# Patient Record
Sex: Male | Born: 1970 | Race: White | Hispanic: No | Marital: Married | Smoking: Never smoker
Health system: Southern US, Community
[De-identification: ages and names within clinical notes are randomized; demographics above are authoritative.]

## PROBLEM LIST (undated history)

## (undated) DIAGNOSIS — K219 Gastro-esophageal reflux disease without esophagitis: Secondary | ICD-10-CM

## (undated) DIAGNOSIS — L309 Dermatitis, unspecified: Secondary | ICD-10-CM

---

## 2003-03-26 HISTORY — PX: NEPHRECTOMY LIVING DONOR: SUR877

## 2010-05-14 LAB — LIPID PANEL
CHOLESTEROL: 127 mg/dL (ref 0–200)
HDL: 50 mg/dL (ref 35–70)
LDL CALC: 63 mg/dL
Triglycerides: 68 mg/dL (ref 40–160)

## 2010-05-14 LAB — PSA: PSA: 2.1

## 2011-04-01 ENCOUNTER — Ambulatory Visit: Payer: Self-pay | Admitting: Internal Medicine

## 2014-11-03 ENCOUNTER — Other Ambulatory Visit: Payer: Self-pay | Admitting: Internal Medicine

## 2015-05-01 ENCOUNTER — Emergency Department: Payer: 59

## 2015-05-01 ENCOUNTER — Emergency Department
Admission: EM | Admit: 2015-05-01 | Discharge: 2015-05-01 | Disposition: A | Discharge status: Home / Self Care | Payer: 59 | Attending: Emergency Medicine | Admitting: Emergency Medicine

## 2015-05-01 ENCOUNTER — Encounter: Payer: Self-pay | Admitting: *Deleted

## 2015-05-01 DIAGNOSIS — Z880 Allergy status to penicillin: Secondary | ICD-10-CM | POA: Insufficient documentation

## 2015-05-01 DIAGNOSIS — K219 Gastro-esophageal reflux disease without esophagitis: Secondary | ICD-10-CM | POA: Insufficient documentation

## 2015-05-01 DIAGNOSIS — R1011 Right upper quadrant pain: Secondary | ICD-10-CM | POA: Diagnosis present

## 2015-05-01 DIAGNOSIS — R109 Unspecified abdominal pain: Secondary | ICD-10-CM

## 2015-05-01 HISTORY — DX: Dermatitis, unspecified: L30.9

## 2015-05-01 HISTORY — DX: Gastro-esophageal reflux disease without esophagitis: K21.9

## 2015-05-01 LAB — URINALYSIS COMPLETE WITH MICROSCOPIC (ARMC ONLY)
BACTERIA UA: NONE SEEN
BILIRUBIN URINE: NEGATIVE
GLUCOSE, UA: NEGATIVE mg/dL
Hgb urine dipstick: NEGATIVE
LEUKOCYTES UA: NEGATIVE
NITRITE: NEGATIVE
Protein, ur: NEGATIVE mg/dL
SPECIFIC GRAVITY, URINE: 1.017 (ref 1.005–1.030)
pH: 6 (ref 5.0–8.0)

## 2015-05-01 LAB — COMPREHENSIVE METABOLIC PANEL
ALBUMIN: 4.6 g/dL (ref 3.5–5.0)
ALK PHOS: 70 U/L (ref 38–126)
ALT: 20 U/L (ref 17–63)
AST: 22 U/L (ref 15–41)
Anion gap: 3 — ABNORMAL LOW (ref 5–15)
BILIRUBIN TOTAL: 1.1 mg/dL (ref 0.3–1.2)
BUN: 10 mg/dL (ref 6–20)
CALCIUM: 9.4 mg/dL (ref 8.9–10.3)
CO2: 30 mmol/L (ref 22–32)
CREATININE: 1.32 mg/dL — AB (ref 0.61–1.24)
Chloride: 108 mmol/L (ref 101–111)
GFR calc Af Amer: >60 mL/min (ref >60)
GLUCOSE: 105 mg/dL — AB (ref 65–99)
Potassium: 3.1 mmol/L — ABNORMAL LOW (ref 3.5–5.1)
Sodium: 141 mmol/L (ref 135–145)
TOTAL PROTEIN: 7.5 g/dL (ref 6.5–8.1)

## 2015-05-01 LAB — LIPASE, BLOOD: Lipase: 42 U/L (ref 11–51)

## 2015-05-01 LAB — CBC
HEMATOCRIT: 42.2 % (ref 40.0–52.0)
Hemoglobin: 14.5 g/dL (ref 13.0–18.0)
MCH: 30.6 pg (ref 26.0–34.0)
MCHC: 34.4 g/dL (ref 32.0–36.0)
MCV: 88.8 fL (ref 80.0–100.0)
PLATELETS: 235 10*3/uL (ref 150–440)
RBC: 4.75 MIL/uL (ref 4.40–5.90)
RDW: 12.4 % (ref 11.5–14.5)
WBC: 6.8 10*3/uL (ref 3.8–10.6)

## 2015-05-01 MED ORDER — MORPHINE SULFATE (PF) 2 MG/ML IV SOLN: 2.0000 mg | Freq: Once | INTRAVENOUS | Status: DC

## 2015-05-01 MED ORDER — ONDANSETRON HCL 4 MG/2ML IJ SOLN
4.0000 mg | Freq: Once | INTRAMUSCULAR | Status: AC
  Administered 2015-05-01: 4 mg via INTRAVENOUS

## 2015-05-01 MED ORDER — IOHEXOL 300 MG/ML  SOLN
85.0000 mL | Freq: Once | INTRAMUSCULAR | Status: AC | PRN
  Administered 2015-05-01: 85 mL via INTRAVENOUS

## 2015-05-01 MED ORDER — RANITIDINE HCL 75 MG PO TABS: 75.0000 mg | ORAL_TABLET | Freq: Two times a day (BID) | ORAL | Status: DC

## 2015-05-01 MED ORDER — POTASSIUM CHLORIDE CRYS ER 20 MEQ PO TBCR
40.0000 meq | EXTENDED_RELEASE_TABLET | Freq: Once | ORAL | Status: AC
  Administered 2015-05-01: 40 meq via ORAL
  Filled 2015-05-01: qty 2

## 2015-05-01 MED ORDER — MORPHINE SULFATE (PF) 2 MG/ML IV SOLN
INTRAVENOUS | Status: AC
  Filled 2015-05-01: qty 1

## 2015-05-01 MED ORDER — PANTOPRAZOLE SODIUM 40 MG IV SOLR
INTRAVENOUS | Status: AC
  Administered 2015-05-01: 40 mg via INTRAVENOUS
  Filled 2015-05-01: qty 40

## 2015-05-01 MED ORDER — ONDANSETRON HCL 4 MG/2ML IJ SOLN
INTRAMUSCULAR | Status: AC
  Administered 2015-05-01: 4 mg via INTRAVENOUS
  Filled 2015-05-01: qty 2

## 2015-05-01 MED ORDER — SODIUM CHLORIDE 0.9 % IV BOLUS (SEPSIS)
1000.0000 mL | Freq: Once | INTRAVENOUS | Status: AC
  Administered 2015-05-01: 1000 mL via INTRAVENOUS

## 2015-05-01 MED ORDER — PANTOPRAZOLE SODIUM 40 MG IV SOLR
40.0000 mg | Freq: Once | INTRAVENOUS | Status: AC
  Administered 2015-05-01: 40 mg via INTRAVENOUS

## 2015-05-01 MED ORDER — IOHEXOL 240 MG/ML SOLN
25.0000 mL | Freq: Once | INTRAMUSCULAR | Status: AC | PRN
  Administered 2015-05-01: 25 mL via ORAL

## 2015-05-01 NOTE — ED Provider Notes (Signed)
[facility] Emergency Department Provider Note  ____________________________________________  Time seen: Approximately 7:10 AM  I have reviewed the triage vital signs and the nursing notes.   HISTORY  Chief Complaint Abdominal Pain    HPI Brent Myers is a 45 y.o. male with a history of acid reflux and a right-sided nephrectomy who is presenting today with 2 days of right upper quadrant and right lower quadrant abdominal pain. He says that it worsened after eating [place] food 2 days ago. He says that it feels like "a rock" in my stomach. Says that his pain is improved at this point after receiving pain medications prior to me examining him. Also has a history of a right-sided nephrectomy. Denies any nausea or vomiting. Denies any blood in his urine. Says that he did have diarrhea this past Saturday night but it has since subsided. Denies any radiation of the pain.   Past Medical History  Diagnosis Date  . Acid reflux   . Eczema     There are no active problems to display for this patient.   Past Surgical History  Procedure Laterality Date  . Nephrectomy living donor Right     No current outpatient prescriptions on file.  Allergies Penicillins; Sesame seed (diagnostic); and Shellfish allergy  History reviewed. No pertinent family history.  Social History Social History  Substance Use Topics  . Smoking status: Never Smoker   . Smokeless tobacco: Never Used  . Alcohol Use: Yes     Comment: rarely    Review of Systems Constitutional: No fever/chills Eyes: No visual changes. ENT: No sore throat. Cardiovascular: Denies chest pain. Respiratory: Denies shortness of breath. Gastrointestinal:   No nausea, no vomiting.  No constipation. Genitourinary: Negative for dysuria. Musculoskeletal: Negative for back pain. Skin: Negative for rash. Neurological: Negative for headaches, focal weakness or numbness.  10-point ROS otherwise  negative.  ____________________________________________   PHYSICAL EXAM:  VITAL SIGNS: ED Triage Vitals  Enc Vitals Group     BP 05/01/15 0549 125/75 mmHg     Pulse Rate 05/01/15 0549 97     Resp 05/01/15 0549 24     Temp 05/01/15 0549 97.9 F (36.6 C)     Temp Source 05/01/15 0549 Oral     SpO2 05/01/15 0549 100 %     Weight 05/01/15 0549 175 lb (79.379 kg)     Height 05/01/15 0549 5\' 10"  (1.778 m)     Head Cir --      Peak Flow --      Pain Score 05/01/15 0542 3     Pain Loc --      Pain Edu? --      Excl. in [place]? --     Constitutional: Alert and oriented. Well appearing and in no acute distress. Eyes: Conjunctivae are normal. PERRL. EOMI. Head: Atraumatic. Nose: No congestion/rhinnorhea. Mouth/Throat: Mucous membranes are moist.   Neck: No stridor.   Cardiovascular: Normal rate, regular rhythm. Grossly normal heart sounds.  Good peripheral circulation. Respiratory: Normal respiratory effort.  No retractions. Lungs CTAB. Gastrointestinal: Soft with mild tenderness to the right upper as well as right lower quadrants. There is a negative Murphy sign. No distention.  No CVA tenderness. Musculoskeletal: No lower extremity tenderness nor edema.  No joint effusions. Neurologic:  Normal speech and language. No gross focal neurologic deficits are appreciated. No gait instability. Skin:  Skin is warm, dry and intact. No rash noted. Psychiatric: Mood and affect are normal. Speech and behavior are  normal.  ____________________________________________   LABS (all labs ordered are listed, but only abnormal results are displayed)  Labs Reviewed  COMPREHENSIVE METABOLIC PANEL - Abnormal; Notable for the following:    Potassium 3.1 (*)    Glucose, Bld 105 (*)    Creatinine, Ser 1.32 (*)    Anion gap 3 (*)    All other components within normal limits  URINALYSIS COMPLETEWITH MICROSCOPIC ([facility] ONLY) - Abnormal; Notable for the following:    Color, Urine YELLOW (*)    APPearance  CLEAR (*)    Ketones, ur TRACE (*)    Squamous Epithelial / LPF 0-5 (*)    All other components within normal limits  LIPASE, BLOOD  CBC   ____________________________________________  EKG  ED ECG REPORT I, Doran Stabler, the attending physician, personally viewed and interpreted this ECG.   Date: 05/01/2015  EKG Time: 545  Rate: 97  Rhythm: normal sinus rhythm  Axis: Normal axis  Intervals:none  ST&T Change: No ST segment elevation or depressions. No abnormal T-wave inversion.  ____________________________________________  RADIOLOGY  No cause for the patient's symptoms identified on the CAT scan. No acute abnormalities. ____________________________________________   PROCEDURES   ____________________________________________   INITIAL IMPRESSION / ASSESSMENT AND PLAN / ED COURSE  Pertinent labs & imaging results that were available during my care of the patient were reviewed by me and considered in my medical decision making (see chart for details).  ----------------------------------------- 9:35 AM on 05/01/2015 -----------------------------------------  Patient remains resting comfortably and is asleep when I reentered the room to reevaluate. We discussed the reassuring labs as well as imaging results. I'll be discharging the patient today with a prescription for Zantac. The symptoms started after eating spicy food this past Saturday night and also associated with diarrhea. He says that he doesn't a history of acid reflux and occasionally uses Tums for symptomatically relief.  Continues to not have any chest pain or shortness of breath. Will follow-up with his primary care doctor.  ____________________________________________   FINAL CLINICAL IMPRESSION(S) / ED DIAGNOSES  Abdominal pain. GERD.    Orbie Pyo, MD 05/01/15 [phone]

## 2015-05-01 NOTE — ED Notes (Signed)
Pt restless in bed; rates pain 3/10; says he is feeling "flush" but thinks it's just nerves

## 2015-05-01 NOTE — ED Notes (Addendum)
Pt states persistent recurring pain in RUQ of abdomen. Pt states he is unable to sleep due to the pain and it is worse when he is laying down. Pt denies cardiac hx. Pt denies radiation of pain. Pt denies n/v, endorses diarrhea.

## 2015-05-01 NOTE — Discharge Instructions (Signed)

## 2015-05-03 ENCOUNTER — Encounter: Payer: Self-pay | Admitting: Internal Medicine

## 2015-05-03 DIAGNOSIS — N401 Enlarged prostate with lower urinary tract symptoms: Secondary | ICD-10-CM | POA: Insufficient documentation

## 2015-05-03 DIAGNOSIS — K219 Gastro-esophageal reflux disease without esophagitis: Secondary | ICD-10-CM | POA: Insufficient documentation

## 2015-05-10 ENCOUNTER — Encounter: Payer: Self-pay | Admitting: Internal Medicine

## 2015-05-10 ENCOUNTER — Ambulatory Visit (INDEPENDENT_AMBULATORY_CARE_PROVIDER_SITE_OTHER): Payer: 59 | Admitting: Internal Medicine

## 2015-05-10 VITALS — BP 110/68 | HR 80 | Ht 70.0 in | Wt 175.4 lb

## 2015-05-10 DIAGNOSIS — L309 Dermatitis, unspecified: Secondary | ICD-10-CM

## 2015-05-10 DIAGNOSIS — R1011 Right upper quadrant pain: Secondary | ICD-10-CM

## 2015-05-10 MED ORDER — CLOBETASOL PROPIONATE 0.05 % EX CREA: 1.0000 "application " | TOPICAL_CREAM | Freq: Two times a day (BID) | CUTANEOUS | Status: DC

## 2015-05-10 NOTE — Progress Notes (Signed)
Date:  05/10/2015   Name:  Brent Myers   DOB:  Sep 20, 1970   MRN:  [id number]   Chief Complaint: Digestive Issues Abdominal Pain This is a recurrent (in ER 10 days ago) problem. The problem has been gradually improving. The pain is located in the epigastric region. The quality of the pain is aching, cramping and a sensation of fullness. The abdominal pain radiates to the back. Pertinent negatives include no constipation, diarrhea, dysuria, fever, hematuria, nausea or vomiting. The pain is aggravated by certain positions. The pain is relieved by nothing. He has tried nothing for the symptoms. Prior diagnostic workup includes CT scan (completely normal). His past medical history is significant for abdominal surgery (donor nephrectomy).  He definitely had increase in symptoms after eating [place] food initially and then again last night after eating at the [employer].  In between today in the emergency room visit he been eating very bland food and was gradually feeling better.  Review of Systems  Constitutional: Negative for fever, chills, fatigue and unexpected weight change.  Respiratory: Negative for choking, shortness of breath and wheezing.   Cardiovascular: Negative for chest pain.  Gastrointestinal: Positive for abdominal pain and abdominal distention. Negative for nausea, vomiting, diarrhea, constipation and blood in stool.  Genitourinary: Negative for dysuria and hematuria.  Skin: Positive for rash.  Psychiatric/Behavioral: Negative for sleep disturbance and dysphoric mood.    Patient Active Problem List   Diagnosis Date Noted  . Gastroesophageal reflux disease 05/03/2015  . 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 Benign prostatic hypertrophy with lower urinary tract symptoms (LUTS) 05/03/2015    Prior to Admission medications   Not on File    Allergies  Allergen Reactions  . Penicillins Other (See Comments)    Has patient had a PCN reaction causing immediate rash,  facial/tongue/throat swelling, SOB or lightheadedness with hypotension: Unsure Has patient had a PCN reaction causing severe rash involving mucus membranes or skin necrosis: no Has patient had a PCN reaction that required hospitalization: no Has patient had a PCN reaction occurring within the last 10 years: no If all of the above answers are "NO", then may proceed with Cephalosporin use.   Marland Kitchen Sesame Seed (Diagnostic) Hives  . Shellfish Allergy     Past Surgical History  Procedure Laterality Date  . Nephrectomy living donor Right 2005    Social History  Substance Use Topics  . Smoking status: Never Smoker   . Smokeless tobacco: Never Used  . Alcohol Use: 2.4 oz/week    4 Standard drinks or equivalent per week     Comment: rarely    Medication list has been reviewed and updated.   Physical Exam  Constitutional: He is oriented to person, place, and time. He appears well-developed. No distress.  HENT:  Head: Normocephalic and atraumatic.  Neck: Normal range of motion. Neck supple.  Cardiovascular: Normal rate, regular rhythm and normal heart sounds.   Pulmonary/Chest: Effort normal and breath sounds normal. No respiratory distress.  Abdominal: Soft. Bowel sounds are normal. He exhibits no distension. There is no hepatosplenomegaly. There is tenderness in the right upper quadrant. There is no CVA tenderness.  Musculoskeletal: Normal range of motion.  Neurological: He is alert and oriented to person, place, and time.  Skin: Skin is warm and dry. No rash noted.  Psychiatric: He has a normal mood and affect. His speech is normal and behavior is normal. Thought content normal.    BP 110/68 mmHg  Pulse 80  Ht 5\' 10"  (1.778 m)  Wt 175 lb 6.4 oz (79.561 kg)  BMI 25.17 kg/m2  Assessment and Plan: 1. Right upper quadrant pain Suspect dysfunctional gallbladder but will rule out H. Pylori first Recommend low fat low protein diet - H. pylori antibody, IgG - NM Hepato W/Eject Fract;  Future  2. Eczema - clobetasol cream (TEMOVATE) 0.05 %; Apply 1 application topically 2 (two) times daily.  Dispense: 30 g; Refill: [place], MD [facility]  05/10/2015

## 2015-05-11 ENCOUNTER — Telehealth: Payer: Self-pay

## 2015-05-11 LAB — H. PYLORI ANTIBODY, IGG: H Pylori IgG: <0.9 U/mL (ref 0.0–0.8)

## 2015-05-11 NOTE — Telephone Encounter (Signed)
Spoke with patient. Patient advised of all results and verbalized understanding. Will call back with any future questions or concerns. MAH  

## 2015-05-11 NOTE — Telephone Encounter (Signed)
-----   Message from Glean Hess, MD sent at 05/11/2015  1:10 PM EST ----- H Pylori is negative. Waiting for HIDA scan results.

## 2015-05-24 ENCOUNTER — Ambulatory Visit
Admission: RE | Admit: 2015-05-24 | Discharge: 2015-05-24 | Disposition: A | Discharge status: Home / Self Care | Payer: 59 | Source: Ambulatory Visit | Attending: Internal Medicine | Admitting: Internal Medicine

## 2015-05-24 DIAGNOSIS — R1011 Right upper quadrant pain: Secondary | ICD-10-CM | POA: Diagnosis present

## 2015-05-24 MED ORDER — TECHNETIUM TC 99M MEBROFENIN IV KIT
5.3480 | PACK | Freq: Once | INTRAVENOUS | Status: AC | PRN
  Administered 2015-05-24: 5.348 via INTRAVENOUS

## 2015-05-24 MED ORDER — SINCALIDE 5 MCG IJ SOLR
0.0200 ug/kg | Freq: Once | INTRAMUSCULAR | Status: AC
  Administered 2015-05-24: 1.59 ug via INTRAVENOUS
  Filled 2015-05-24: qty 5

## 2015-05-25 ENCOUNTER — Telehealth: Payer: Self-pay

## 2015-05-25 NOTE — Telephone Encounter (Signed)
Spoke with patient. Patient advised of all results and verbalized understanding. Will call back with any future questions or concerns. MAH  

## 2015-05-25 NOTE — Telephone Encounter (Signed)
-----   Message from Glean Hess, MD sent at 05/24/2015  1:15 PM EST ----- HIDA scan was normal with no evidence of gall bladder dysfunction.  I recommend GI evaluation if symptoms continue.

## 2015-06-05 ENCOUNTER — Telehealth: Payer: Self-pay

## 2015-06-05 DIAGNOSIS — K219 Gastro-esophageal reflux disease without esophagitis: Secondary | ICD-10-CM

## 2015-06-05 NOTE — Telephone Encounter (Signed)
Patient states that he would like to be set up with GI as discussed last week.

## 2015-07-05 ENCOUNTER — Encounter: Payer: Self-pay | Admitting: Gastroenterology

## 2015-07-05 ENCOUNTER — Encounter (INDEPENDENT_AMBULATORY_CARE_PROVIDER_SITE_OTHER): Payer: Self-pay

## 2015-07-05 ENCOUNTER — Ambulatory Visit (INDEPENDENT_AMBULATORY_CARE_PROVIDER_SITE_OTHER): Payer: 59 | Admitting: Gastroenterology

## 2015-07-05 ENCOUNTER — Other Ambulatory Visit: Payer: Self-pay

## 2015-07-05 ENCOUNTER — Other Ambulatory Visit: Payer: Self-pay | Admitting: Gastroenterology

## 2015-07-05 VITALS — BP 141/67 | HR 66 | Temp 98.3°F | Ht 70.0 in | Wt 171.0 lb

## 2015-07-05 DIAGNOSIS — R1013 Epigastric pain: Secondary | ICD-10-CM

## 2015-07-05 DIAGNOSIS — K219 Gastro-esophageal reflux disease without esophagitis: Secondary | ICD-10-CM | POA: Diagnosis not present

## 2015-07-05 DIAGNOSIS — G8929 Other chronic pain: Secondary | ICD-10-CM | POA: Diagnosis not present

## 2015-07-05 DIAGNOSIS — R634 Abnormal weight loss: Secondary | ICD-10-CM

## 2015-07-05 NOTE — Progress Notes (Signed)
Gastroenterology Consultation  Referring Provider:     Glean Hess, MD Primary Care Physician:  Halina Maidens, MD Primary Gastroenterologist:  Dr. Allen Norris     Reason for Consultation:     Heartburn        HPI:   Brent Myers is a 45 y.o. y/o male referred for consultation & management of Heartburn by Dr. Halina Maidens, MD.  This patient comes today after a report of having severe epigastric pain approximately 2 months ago. The patient states that lasted for a couple of hours. Then it resolved. A few weeks later the patient had another similar episode with epigastric discomfort. The patient states that he was eating greasy food at the time. There is no report of any nausea or vomiting. The patient has lost a couple pounds during this time without trying. There is no report of any black stools or bloody stools. He also denies any nausea vomiting. The patient had been in his usual state of health prior to this happening and the only thing that has happened since then is that he ports increased heartburn symptoms. The patient states he takes Tums once in a while for the heartburn and has some help with this. There is no report of any family history of GI problems or report of him having heartburn in the past.  Past Medical History  Diagnosis Date  . Acid reflux   . Eczema     Past Surgical History  Procedure Laterality Date  . Nephrectomy living donor Right 2005    Prior to Admission medications   Medication Sig Start Date End Date Taking? Authorizing Provider  clobetasol cream (TEMOVATE) [id number] % Apply 1 application topically 2 (two) times daily. 05/10/15  Yes Glean Hess, MD    Family History  Problem Relation Age of Onset  . Thyroid disease Father   . Renal Disease Mother      Social History  Substance Use Topics  . Smoking status: Never Smoker   . Smokeless tobacco: Never Used  . Alcohol Use: 2.4 oz/week    4 Standard drinks or equivalent per week     Comment: rarely     Allergies as of 07/05/2015 - Review Complete 07/05/2015  Allergen Reaction Noted  . Penicillins Other (See Comments) 05/01/2015  . Sesame seed (diagnostic) Hives 05/01/2015  . Shellfish allergy  05/01/2015    Review of Systems:    All systems reviewed and negative except where noted in HPI.   Physical Exam:  BP 141/67 mmHg  Pulse 66  Temp(Src) 98.3 F (36.8 C) (Oral)  Ht 5\' 10"  (1.778 m)  Wt 171 lb (77.565 kg)  BMI 24.54 kg/m2 No LMP for male patient. Psych:  Alert and cooperative. Normal mood and affect. General:   Alert,  Well-developed, well-nourished, pleasant and cooperative in NAD Head:  Normocephalic and atraumatic. Eyes:  Sclera clear, no icterus.   Conjunctiva pink. Ears:  Normal auditory acuity. Nose:  No deformity, discharge, or lesions. Mouth:  No deformity or lesions,oropharynx pink & moist. Neck:  Supple; no masses or thyromegaly. Lungs:  Respirations even and unlabored.  Clear throughout to auscultation.   No wheezes, crackles, or rhonchi. No acute distress. Heart:  Regular rate and rhythm; no murmurs, clicks, rubs, or gallops. Abdomen:  Normal bowel sounds.  No bruits.  Soft, non-tender and non-distended without masses, hepatosplenomegaly or hernias noted.  No guarding or rebound tenderness.  Negative Carnett sign.   Rectal:  Deferred.  Msk:  Symmetrical without gross deformities.  Good, equal movement & strength bilaterally. Pulses:  Normal pulses noted. Extremities:  No clubbing or edema.  No cyanosis. Neurologic:  Alert and oriented x3;  grossly normal neurologically. Skin:  Intact without significant lesions or rashes.  No jaundice. Lymph Nodes:  No significant cervical adenopathy. Psych:  Alert and cooperative. Normal mood and affect.  Imaging Studies: No results found.  Assessment and Plan:   Brent Myers is a 45 y.o. y/o male who comes in today with 2 episodes of severe abdominal pain. The patient is a normal CT scan and a HIDA scan with a  normal ejection fraction. The patient's CT scan also did not show any cause for his abdominal pain. The patient has also lost a few pounds unintentionally. The patient will be started on a trial of Dexilant. He has been told to stay away from the Tums. The patient will also be set up for an upper endoscopy.I have discussed risks & benefits which include, but are not limited to, bleeding, infection, perforation & drug reaction.  The patient agrees with this plan & written consent will be obtained.      Note: This dictation was prepared with Dragon dictation along with smaller phrase technology. Any transcriptional errors that result from this process are unintentional.

## 2015-09-07 ENCOUNTER — Encounter: Payer: Self-pay | Admitting: Internal Medicine

## 2015-09-07 ENCOUNTER — Ambulatory Visit (INDEPENDENT_AMBULATORY_CARE_PROVIDER_SITE_OTHER): Payer: 59 | Admitting: Internal Medicine

## 2015-09-07 VITALS — BP 122/86 | HR 84 | Resp 16 | Ht 70.0 in | Wt 168.0 lb

## 2015-09-07 DIAGNOSIS — Z125 Encounter for screening for malignant neoplasm of prostate: Secondary | ICD-10-CM

## 2015-09-07 DIAGNOSIS — K219 Gastro-esophageal reflux disease without esophagitis: Secondary | ICD-10-CM

## 2015-09-07 DIAGNOSIS — L309 Dermatitis, unspecified: Secondary | ICD-10-CM

## 2015-09-07 DIAGNOSIS — Z0000 Encounter for general adult medical examination without abnormal findings: Secondary | ICD-10-CM

## 2015-09-07 DIAGNOSIS — Z23 Encounter for immunization: Secondary | ICD-10-CM | POA: Diagnosis not present

## 2015-09-07 LAB — POCT URINALYSIS DIPSTICK
BILIRUBIN UA: NEGATIVE
Blood, UA: NEGATIVE
Glucose, UA: NEGATIVE
Ketones, UA: NEGATIVE
LEUKOCYTES UA: NEGATIVE
NITRITE UA: NEGATIVE
PH UA: 7.5
PROTEIN UA: NEGATIVE
Spec Grav, UA: 1.01
Urobilinogen, UA: 0.2

## 2015-09-07 NOTE — Progress Notes (Signed)
Date:  09/07/2015   Name:  Brent DEENEY   DOB:  1970-11-04   MRN:  [id number]   Chief Complaint: Annual Exam Brent Myers is a 45 y.o. male who presents today for his Complete Annual Exam. He feels fairly well. He reports exercising regularly. He reports he is sleeping fairly well.   Gastroesophageal Reflux He reports no abdominal pain, no chest pain, no coughing, no sore throat or no wheezing. The problem occurs rarely. The problem has been gradually improving. Pertinent negatives include no fatigue. He has tried nothing for the symptoms.  He was seen by GI after CT scan and HIDA were normal.  Dr. Allen Norris felt he had dyspepsia and recommended a trial of PPI.  Patient did not take it after reading about side effects.  He feels better with a change in diet from fatty foods to more fresh fruits and vegetables.   Review of Systems  Constitutional: Negative for fever, chills and fatigue.  HENT: Negative for congestion, hearing loss, sore throat, tinnitus and trouble swallowing.   Eyes: Negative for visual disturbance.  Respiratory: Negative for cough, chest tightness, shortness of breath and wheezing.   Cardiovascular: Negative for chest pain, palpitations and leg swelling.  Gastrointestinal: Negative for abdominal pain and blood in stool.  Genitourinary: Negative for urgency, frequency, hematuria, decreased urine volume and difficulty urinating.  Musculoskeletal: Positive for back pain. Negative for joint swelling and arthralgias.  Skin: Negative for rash and wound.  Neurological: Negative for dizziness, tremors, numbness and headaches.  Hematological: Negative for adenopathy.  Psychiatric/Behavioral: Positive for sleep disturbance (intermittently). Negative for dysphoric mood.    Patient Active Problem List   Diagnosis Date Noted  . Right upper quadrant pain 05/10/2015  . Eczema 05/10/2015  . Gastroesophageal reflux disease 05/03/2015  . 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 Benign  prostatic hypertrophy with lower urinary tract symptoms (LUTS) 05/03/2015    Prior to Admission medications   Not on File    Allergies  Allergen Reactions  . Penicillins Other (See Comments)    Has patient had a PCN reaction causing immediate rash, facial/tongue/throat swelling, SOB or lightheadedness with hypotension: Unsure Has patient had a PCN reaction causing severe rash involving mucus membranes or skin necrosis: no Has patient had a PCN reaction that required hospitalization: no Has patient had a PCN reaction occurring within the last 10 years: no If all of the above answers are "NO", then may proceed with Cephalosporin use.   Marland Kitchen Sesame Seed (Diagnostic) Hives  . Shellfish Allergy     Past Surgical History  Procedure Laterality Date  . Nephrectomy living donor Right 2005    Social History  Substance Use Topics  . Smoking status: Never Smoker   . Smokeless tobacco: Never Used  . Alcohol Use: 2.4 oz/week    4 Standard drinks or equivalent per week     Comment: rarely     Medication list has been reviewed and updated.   Physical Exam  Constitutional: He is oriented to person, place, and time. He appears well-developed and well-nourished.  HENT:  Head: Normocephalic.  Right Ear: Tympanic membrane, external ear and ear canal normal.  Left Ear: Tympanic membrane, external ear and ear canal normal.  Nose: Nose normal.  Mouth/Throat: Uvula is midline and oropharynx is clear and moist.  Eyes: Conjunctivae and EOM are normal. Pupils are equal, round, and reactive to light.  Neck: Normal range of motion. Neck supple. Carotid bruit is not present. No thyromegaly  present.  Cardiovascular: Normal rate, regular rhythm, normal heart sounds and intact distal pulses.   Pulmonary/Chest: Effort normal and breath sounds normal. He has no wheezes. Right breast exhibits no mass. Left breast exhibits no mass.  Abdominal: Soft. Normal appearance and bowel sounds are normal. There is no  hepatosplenomegaly. There is no tenderness.  Musculoskeletal: Normal range of motion.  Lymphadenopathy:    He has no cervical adenopathy.  Neurological: He is alert and oriented to person, place, and time. He has normal reflexes.  Skin: Skin is warm, dry and intact.  Psychiatric: He has a normal mood and affect. His speech is normal and behavior is normal. Judgment and thought content normal.  Nursing note and vitals reviewed.   BP 122/86 mmHg  Pulse 84  Resp 16  Ht 5\' 10"  (1.778 m)  Wt 168 lb (76.204 kg)  BMI 24.11 kg/m2  SpO2 100%  Assessment and Plan: 1. Annual physical exam Normal exam Recommend otc herbal sleep aids PRN - Comprehensive metabolic panel - Lipid panel - POCT urinalysis dipstick  2. Gastroesophageal reflux disease, esophagitis presence not specified Trial of dexilant daily for 2 weeks suggested - if symptoms persist, then refer back to GI for EGD - CBC with Differential/Platelet  3. Prostate cancer screening DRE deferred to lack of symptoms - PSA  4. Eczema Continue topical therapy as needed  5. Need for Tdap vaccination - Tdap vaccine greater than or equal to 7yo IM   Halina Maidens, MD [facility]  09/07/2015

## 2015-09-07 NOTE — Patient Instructions (Signed)
Tdap Vaccine (Tetanus, Diphtheria and Pertussis): What You Need to Know 1. Why get vaccinated? Tetanus, diphtheria and pertussis are very serious diseases. Tdap vaccine can protect us from these diseases. And, Tdap vaccine given to pregnant women can protect newborn babies against pertussis. TETANUS (Lockjaw) is rare in the [place] today. It causes painful muscle tightening and stiffness, usually all over the body.  It can lead to tightening of muscles in the head and neck so you can't open your mouth, swallow, or sometimes even breathe. Tetanus kills about 1 out of 10 people who are infected even after receiving the best medical care. DIPHTHERIA is also rare in the [place] today. It can cause a thick coating to form in the back of the throat.  It can lead to breathing problems, heart failure, paralysis, and death. PERTUSSIS (Whooping Cough) causes severe coughing spells, which can cause difficulty breathing, vomiting and disturbed sleep.  It can also lead to weight loss, incontinence, and rib fractures. Up to 2 in 100 adolescents and 5 in 100 adults with pertussis are hospitalized or have complications, which could include pneumonia or death. These diseases are caused by bacteria. Diphtheria and pertussis are spread from person to person through secretions from coughing or sneezing. Tetanus enters the body through cuts, scratches, or wounds. Before vaccines, as many as 200,000 cases of diphtheria, 200,000 cases of pertussis, and hundreds of cases of tetanus, were reported in the [place] each year. Since vaccination began, reports of cases for tetanus and diphtheria have dropped by about 99% and for pertussis by about 80%. 2. Tdap vaccine Tdap vaccine can protect adolescents and adults from tetanus, diphtheria, and pertussis. One dose of Tdap is routinely given at age 11 or 12. People who did not get Tdap at that age should get it as soon as possible. Tdap is especially important  for healthcare professionals and anyone having close contact with a baby younger than 12 months. Pregnant women should get a dose of Tdap during every pregnancy, to protect the newborn from pertussis. Infants are most at risk for severe, life-threatening complications from pertussis. Another vaccine, called Td, protects against tetanus and diphtheria, but not pertussis. A Td booster should be given every 10 years. Tdap may be given as one of these boosters if you have never gotten Tdap before. Tdap may also be given after a severe cut or burn to prevent tetanus infection. Your doctor or the person giving you the vaccine can give you more information. Tdap may safely be given at the same time as other vaccines. 3. Some people should not get this vaccine  A person who has ever had a life-threatening allergic reaction after a previous dose of any diphtheria, tetanus or pertussis containing vaccine, OR has a severe allergy to any part of this vaccine, should not get Tdap vaccine. Tell the person giving the vaccine about any severe allergies.  Anyone who had coma or long repeated seizures within 7 days after a childhood dose of DTP or DTaP, or a previous dose of Tdap, should not get Tdap, unless a cause other than the vaccine was found. They can still get Td.  Talk to your doctor if you:  have seizures or another nervous system problem,  had severe pain or swelling after any vaccine containing diphtheria, tetanus or pertussis,  ever had a condition called Guillain-Barr Syndrome (GBS),  aren't feeling well on the day the shot is scheduled. 4. Risks With any medicine, including vaccines, there is   a chance of side effects. These are usually mild and go away on their own. Serious reactions are also possible but are rare. Most people who get Tdap vaccine do not have any problems with it. Mild problems following Tdap (Did not interfere with activities)  Pain where the shot was given (about 3 in 4  adolescents or 2 in 3 adults)  Redness or swelling where the shot was given (about 1 person in 5)  Mild fever of at least 100.4F (up to about 1 in 25 adolescents or 1 in 100 adults)  Headache (about 3 or 4 people in 10)  Tiredness (about 1 person in 3 or 4)  Nausea, vomiting, diarrhea, stomach ache (up to 1 in 4 adolescents or 1 in 10 adults)  Chills, sore joints (about 1 person in 10)  Body aches (about 1 person in 3 or 4)  Rash, swollen glands (uncommon) Moderate problems following Tdap (Interfered with activities, but did not require medical attention)  Pain where the shot was given (up to 1 in 5 or 6)  Redness or swelling where the shot was given (up to about 1 in 16 adolescents or 1 in 12 adults)  Fever over 102F (about 1 in 100 adolescents or 1 in 250 adults)  Headache (about 1 in 7 adolescents or 1 in 10 adults)  Nausea, vomiting, diarrhea, stomach ache (up to 1 or 3 people in 100)  Swelling of the entire arm where the shot was given (up to about 1 in 500). Severe problems following Tdap (Unable to perform usual activities; required medical attention)  Swelling, severe pain, bleeding and redness in the arm where the shot was given (rare). Problems that could happen after any vaccine:  People sometimes faint after a medical procedure, including vaccination. Sitting or lying down for about 15 minutes can help prevent fainting, and injuries caused by a fall. Tell your doctor if you feel dizzy, or have vision changes or ringing in the ears.  Some people get severe pain in the shoulder and have difficulty moving the arm where a shot was given. This happens very rarely.  Any medication can cause a severe allergic reaction. Such reactions from a vaccine are very rare, estimated at fewer than 1 in a million doses, and would happen within a few minutes to a few hours after the vaccination. As with any medicine, there is a very remote chance of a vaccine causing a serious  injury or death. The safety of vaccines is always being monitored. For more information, visit: www.cdc.gov/vaccinesafety/ 5. What if there is a serious problem? What should I look for?  Look for anything that concerns you, such as signs of a severe allergic reaction, very high fever, or unusual behavior.  Signs of a severe allergic reaction can include hives, swelling of the face and throat, difficulty breathing, a fast heartbeat, dizziness, and weakness. These would usually start a few minutes to a few hours after the vaccination. What should I do?  If you think it is a severe allergic reaction or other emergency that can't wait, call 9-1-1 or get the person to the nearest hospital. Otherwise, call your doctor.  Afterward, the reaction should be reported to the Vaccine Adverse Event Reporting System (VAERS). Your doctor might file this report, or you can do it yourself through the VAERS web site at www.vaers.hhs.gov, or by calling 1-800-822-7967. VAERS does not give medical advice.  6. The National Vaccine Injury Compensation Program The National Vaccine Injury Compensation Program (  VICP) is a federal program that was created to compensate people who may have been injured by certain vaccines. Persons who believe they may have been injured by a vaccine can learn about the program and about filing a claim by calling 1-800-338-2382 or visiting the VICP website at www.hrsa.gov/vaccinecompensation. There is a time limit to file a claim for compensation. 7. How can I learn more?  Ask your doctor. He or she can give you the vaccine package insert or suggest other sources of information.  Call your local or state health department.  Contact the Centers for Disease Control and Prevention (CDC):  Call 1-800-232-4636 (1-800-CDC-INFO) or  Visit CDC's website at www.cdc.gov/vaccines CDC Tdap Vaccine VIS (05/18/13)   This information is not intended to replace advice given to you by your health care  provider. Make sure you discuss any questions you have with your health care provider.   Document Released: 09/10/2011 Document Revised: 04/01/2014 Document Reviewed: 06/23/2013 Elsevier Interactive Patient Education 2016 Elsevier Inc.  

## 2015-09-08 LAB — COMPREHENSIVE METABOLIC PANEL
ALBUMIN: 4.4 g/dL (ref 3.5–5.5)
ALT: 18 IU/L (ref 0–44)
AST: 19 IU/L (ref 0–40)
Albumin/Globulin Ratio: 1.7 (ref 1.2–2.2)
Alkaline Phosphatase: 93 IU/L (ref 39–117)
BILIRUBIN TOTAL: 0.7 mg/dL (ref 0.0–1.2)
BUN / CREAT RATIO: 8 — AB (ref 9–20)
BUN: 9 mg/dL (ref 6–24)
CALCIUM: 9.7 mg/dL (ref 8.7–10.2)
CHLORIDE: 104 mmol/L (ref 96–106)
CO2: 26 mmol/L (ref 18–29)
CREATININE: 1.18 mg/dL (ref 0.76–1.27)
GFR, EST AFRICAN AMERICAN: 85 mL/min/{1.73_m2} (ref >59)
GFR, EST NON AFRICAN AMERICAN: 74 mL/min/{1.73_m2} (ref >59)
GLUCOSE: 88 mg/dL (ref 65–99)
Globulin, Total: 2.6 g/dL (ref 1.5–4.5)
Potassium: 4.4 mmol/L (ref 3.5–5.2)
Sodium: 143 mmol/L (ref 134–144)
TOTAL PROTEIN: 7 g/dL (ref 6.0–8.5)

## 2015-09-08 LAB — CBC WITH DIFFERENTIAL/PLATELET
BASOS: 0 %
Basophils Absolute: 0 10*3/uL (ref 0.0–0.2)
EOS (ABSOLUTE): 0.3 10*3/uL (ref 0.0–0.4)
EOS: 6 %
HEMOGLOBIN: 14.5 g/dL (ref 12.6–17.7)
Hematocrit: 42 % (ref 37.5–51.0)
IMMATURE GRANS (ABS): 0 10*3/uL (ref 0.0–0.1)
IMMATURE GRANULOCYTES: 0 %
LYMPHS ABS: 1.3 10*3/uL (ref 0.7–3.1)
Lymphs: 25 %
MCH: 30.6 pg (ref 26.6–33.0)
MCHC: 34.5 g/dL (ref 31.5–35.7)
MCV: 89 fL (ref 79–97)
MONOCYTES: 8 %
Monocytes Absolute: 0.4 10*3/uL (ref 0.1–0.9)
NEUTROS ABS: 3.3 10*3/uL (ref 1.4–7.0)
Neutrophils: 61 %
Platelets: 218 10*3/uL (ref 150–379)
RBC: 4.74 x10E6/uL (ref 4.14–5.80)
RDW: 13.1 % (ref 12.3–15.4)
WBC: 5.4 10*3/uL (ref 3.4–10.8)

## 2015-09-08 LAB — LIPID PANEL
CHOL/HDL RATIO: 2.5 ratio (ref 0.0–5.0)
Cholesterol, Total: 140 mg/dL (ref 100–199)
HDL: 55 mg/dL (ref >39)
LDL CALC: 77 mg/dL (ref 0–99)
Triglycerides: 38 mg/dL (ref 0–149)
VLDL CHOLESTEROL CAL: 8 mg/dL (ref 5–40)

## 2015-09-08 LAB — PSA: PROSTATE SPECIFIC AG, SERUM: 1.7 ng/mL (ref 0.0–4.0)

## 2016-08-05 DIAGNOSIS — M9901 Segmental and somatic dysfunction of cervical region: Secondary | ICD-10-CM | POA: Diagnosis not present

## 2016-08-05 DIAGNOSIS — M9903 Segmental and somatic dysfunction of lumbar region: Secondary | ICD-10-CM | POA: Diagnosis not present

## 2016-08-05 DIAGNOSIS — M9902 Segmental and somatic dysfunction of thoracic region: Secondary | ICD-10-CM | POA: Diagnosis not present

## 2016-08-06 DIAGNOSIS — M9901 Segmental and somatic dysfunction of cervical region: Secondary | ICD-10-CM | POA: Diagnosis not present

## 2016-08-06 DIAGNOSIS — M9902 Segmental and somatic dysfunction of thoracic region: Secondary | ICD-10-CM | POA: Diagnosis not present

## 2016-08-06 DIAGNOSIS — M9903 Segmental and somatic dysfunction of lumbar region: Secondary | ICD-10-CM | POA: Diagnosis not present

## 2016-08-07 DIAGNOSIS — M9903 Segmental and somatic dysfunction of lumbar region: Secondary | ICD-10-CM | POA: Diagnosis not present

## 2016-08-07 DIAGNOSIS — M9902 Segmental and somatic dysfunction of thoracic region: Secondary | ICD-10-CM | POA: Diagnosis not present

## 2016-08-07 DIAGNOSIS — M9901 Segmental and somatic dysfunction of cervical region: Secondary | ICD-10-CM | POA: Diagnosis not present

## 2016-08-08 DIAGNOSIS — M9902 Segmental and somatic dysfunction of thoracic region: Secondary | ICD-10-CM | POA: Diagnosis not present

## 2016-08-08 DIAGNOSIS — M9903 Segmental and somatic dysfunction of lumbar region: Secondary | ICD-10-CM | POA: Diagnosis not present

## 2016-08-08 DIAGNOSIS — M9901 Segmental and somatic dysfunction of cervical region: Secondary | ICD-10-CM | POA: Diagnosis not present

## 2016-08-13 DIAGNOSIS — M9902 Segmental and somatic dysfunction of thoracic region: Secondary | ICD-10-CM | POA: Diagnosis not present

## 2016-08-13 DIAGNOSIS — M9903 Segmental and somatic dysfunction of lumbar region: Secondary | ICD-10-CM | POA: Diagnosis not present

## 2016-08-13 DIAGNOSIS — M9901 Segmental and somatic dysfunction of cervical region: Secondary | ICD-10-CM | POA: Diagnosis not present

## 2016-08-21 DIAGNOSIS — M9902 Segmental and somatic dysfunction of thoracic region: Secondary | ICD-10-CM | POA: Diagnosis not present

## 2016-08-21 DIAGNOSIS — M9903 Segmental and somatic dysfunction of lumbar region: Secondary | ICD-10-CM | POA: Diagnosis not present

## 2016-08-21 DIAGNOSIS — M9901 Segmental and somatic dysfunction of cervical region: Secondary | ICD-10-CM | POA: Diagnosis not present

## 2016-08-27 ENCOUNTER — Other Ambulatory Visit: Payer: Self-pay | Admitting: Internal Medicine

## 2016-08-27 DIAGNOSIS — L309 Dermatitis, unspecified: Secondary | ICD-10-CM

## 2016-11-11 DIAGNOSIS — M9903 Segmental and somatic dysfunction of lumbar region: Secondary | ICD-10-CM | POA: Diagnosis not present

## 2016-11-11 DIAGNOSIS — M9901 Segmental and somatic dysfunction of cervical region: Secondary | ICD-10-CM | POA: Diagnosis not present

## 2016-11-11 DIAGNOSIS — M9902 Segmental and somatic dysfunction of thoracic region: Secondary | ICD-10-CM | POA: Diagnosis not present

## 2016-12-16 DIAGNOSIS — M9902 Segmental and somatic dysfunction of thoracic region: Secondary | ICD-10-CM | POA: Diagnosis not present

## 2016-12-16 DIAGNOSIS — M9903 Segmental and somatic dysfunction of lumbar region: Secondary | ICD-10-CM | POA: Diagnosis not present

## 2016-12-16 DIAGNOSIS — M9901 Segmental and somatic dysfunction of cervical region: Secondary | ICD-10-CM | POA: Diagnosis not present

## 2016-12-23 DIAGNOSIS — M9903 Segmental and somatic dysfunction of lumbar region: Secondary | ICD-10-CM | POA: Diagnosis not present

## 2016-12-23 DIAGNOSIS — M9901 Segmental and somatic dysfunction of cervical region: Secondary | ICD-10-CM | POA: Diagnosis not present

## 2016-12-23 DIAGNOSIS — M9902 Segmental and somatic dysfunction of thoracic region: Secondary | ICD-10-CM | POA: Diagnosis not present

## 2017-01-20 DIAGNOSIS — M9902 Segmental and somatic dysfunction of thoracic region: Secondary | ICD-10-CM | POA: Diagnosis not present

## 2017-01-20 DIAGNOSIS — M9903 Segmental and somatic dysfunction of lumbar region: Secondary | ICD-10-CM | POA: Diagnosis not present

## 2017-01-20 DIAGNOSIS — M9901 Segmental and somatic dysfunction of cervical region: Secondary | ICD-10-CM | POA: Diagnosis not present

## 2017-02-17 DIAGNOSIS — M9902 Segmental and somatic dysfunction of thoracic region: Secondary | ICD-10-CM | POA: Diagnosis not present

## 2017-02-17 DIAGNOSIS — M9903 Segmental and somatic dysfunction of lumbar region: Secondary | ICD-10-CM | POA: Diagnosis not present

## 2017-02-17 DIAGNOSIS — M9901 Segmental and somatic dysfunction of cervical region: Secondary | ICD-10-CM | POA: Diagnosis not present

## 2017-02-26 ENCOUNTER — Encounter: Payer: Self-pay | Admitting: Internal Medicine

## 2017-02-26 ENCOUNTER — Ambulatory Visit: Payer: 59 | Admitting: Internal Medicine

## 2017-02-26 VITALS — BP 122/80 | HR 78 | Resp 16 | Ht 70.0 in | Wt 172.6 lb

## 2017-02-26 DIAGNOSIS — N342 Other urethritis: Secondary | ICD-10-CM

## 2017-02-26 LAB — POCT URINALYSIS DIPSTICK
Bilirubin, UA: NEGATIVE
Blood, UA: NEGATIVE
Glucose, UA: NEGATIVE
KETONES UA: NEGATIVE
LEUKOCYTES UA: NEGATIVE
Nitrite, UA: NEGATIVE
PH UA: 6 (ref 5.0–8.0)
PROTEIN UA: NEGATIVE
SPEC GRAV UA: 1.01 (ref 1.010–1.025)
UROBILINOGEN UA: 0.2 U/dL

## 2017-02-26 MED ORDER — DOXYCYCLINE HYCLATE 100 MG PO TABS: 100.0000 mg | ORAL_TABLET | Freq: Two times a day (BID) | ORAL | 0 refills | Status: AC

## 2017-02-26 NOTE — Progress Notes (Signed)
Date:  02/26/2017   Name:  Brent Myers   DOB:  06-26-1970   MRN:  [id number]   Chief Complaint: Urinary Retention (trouble urinating hx of uti as child ) Urinary Frequency   This is a new problem. The current episode started in the past 7 days. The problem has been unchanged. The patient is experiencing no pain. There has been no fever. He is sexually active. There is no history of pyelonephritis. Associated symptoms include a discharge, frequency, hematuria, hesitancy and vomiting. Pertinent negatives include no chills. His past medical history is significant for a single kidney. There is no history of kidney stones.   He has had a bit of urethral irritation but no discharge.  No recent trauma or prolonged motorcycle/bike rides. He does have some back issues treated by chiropractor.  If he leans forward, he sometimes has tingling around the sides and down to the tip of his penis.   Review of Systems  Constitutional: Negative for chills, fatigue, fever and unexpected weight change.  Respiratory: Negative for chest tightness and shortness of breath.   Cardiovascular: Negative for chest pain and palpitations.  Gastrointestinal: Positive for vomiting. Negative for abdominal pain and blood in stool.  Genitourinary: Positive for frequency, hematuria and hesitancy. Negative for difficulty urinating, discharge, dysuria, penile pain, scrotal swelling and testicular pain.    Patient Active Problem List   Diagnosis Date Noted  . Eczema 05/10/2015  . Gastroesophageal reflux disease 05/03/2015  . 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 Authorizing Provider  doxycycline (VIBRA-TABS) 100 MG tablet Take 1 tablet (100 mg total) by mouth 2 (two) times daily for 10 days. 02/26/17 03/08/17  Glean Hess, MD    Allergies  Allergen Reactions  . Penicillins Other (See Comments)    Has patient had a PCN reaction causing  immediate rash, facial/tongue/throat swelling, SOB or lightheadedness with hypotension: Unsure Has patient had a PCN reaction causing severe rash involving mucus membranes or skin necrosis: no Has patient had a PCN reaction that required hospitalization: no Has patient had a PCN reaction occurring within the last 10 years: no If all of the above answers are "NO", then may proceed with Cephalosporin use.   Marland Kitchen Sesame Seed (Diagnostic) Hives  . Shellfish Allergy     Past Surgical History:  Procedure Laterality Date  . NEPHRECTOMY LIVING DONOR Right 2005    Social History   Tobacco Use  . Smoking status: Never Smoker  . Smokeless tobacco: Never Used  Substance Use Topics  . Alcohol use: Yes    Alcohol/week: 2.4 oz    Types: 4 Standard drinks or equivalent per week    Comment: rarely  . Drug use: No     Medication list has been reviewed and updated.  PHQ 2/9 Scores 02/26/2017 09/07/2015  PHQ - 2 Score 0 0    Physical Exam  Constitutional: He is oriented to person, place, and time. He appears well-developed. No distress.  HENT:  Head: Normocephalic and atraumatic.  Cardiovascular: Normal rate, regular rhythm and normal heart sounds.  Pulmonary/Chest: Effort normal. No respiratory distress. He has wheezes.  Abdominal: Soft. Normal appearance and bowel sounds are normal. There is no CVA tenderness.  Genitourinary:  Genitourinary Comments: deferred  Musculoskeletal: Normal range of motion.  Neurological: He is alert and oriented to person, place, and time.  Skin: Skin is warm and dry. No rash noted.  Psychiatric:  He has a normal mood and affect. His behavior is normal. Thought content normal.  Nursing note and vitals reviewed.   BP 122/80   Pulse 78   Resp 16   Ht 5\' 10"  (1.778 m)   Wt 172 lb 9.6 oz (78.3 kg)   SpO2 100%   BMI 24.77 kg/m   Assessment and Plan: 1. Urethritis - doxycycline (VIBRA-TABS) 100 MG tablet; Take 1 tablet (100 mg total) by mouth 2 (two) times  daily for 10 days.  Dispense: 20 tablet; Refill: 0 - POCT urinalysis dipstick   Meds ordered this encounter  Medications  . doxycycline (VIBRA-TABS) 100 MG tablet    Sig: Take 1 tablet (100 mg total) by mouth 2 (two) times daily for 10 days.    Dispense:  20 tablet    Refill:  0    Partially dictated using Editor, commissioning. Any errors are unintentional.  Halina Maidens, MD [facility]  02/26/2017

## 2017-03-10 DIAGNOSIS — M9901 Segmental and somatic dysfunction of cervical region: Secondary | ICD-10-CM | POA: Diagnosis not present

## 2017-03-10 DIAGNOSIS — M9902 Segmental and somatic dysfunction of thoracic region: Secondary | ICD-10-CM | POA: Diagnosis not present

## 2017-03-10 DIAGNOSIS — M9903 Segmental and somatic dysfunction of lumbar region: Secondary | ICD-10-CM | POA: Diagnosis not present

## 2017-03-12 DIAGNOSIS — M9903 Segmental and somatic dysfunction of lumbar region: Secondary | ICD-10-CM | POA: Diagnosis not present

## 2017-03-12 DIAGNOSIS — M9901 Segmental and somatic dysfunction of cervical region: Secondary | ICD-10-CM | POA: Diagnosis not present

## 2017-03-12 DIAGNOSIS — M9902 Segmental and somatic dysfunction of thoracic region: Secondary | ICD-10-CM | POA: Diagnosis not present

## 2017-08-22 IMAGING — CT CT ABD-PELV W/ CM
1 of 3 series · 14 of 32 positions shown, 19 images · IV contrast (omnipaque)
Comparison: None.

CLINICAL DATA: Persistent recurring right upper quadrant pain,
worse when lying down. Previous right nephrectomy.

EXAM:
CT ABDOMEN AND PELVIS WITH CONTRAST
TECHNIQUE: Multidetector CT imaging of the abdomen and pelvis was performed
using the standard protocol following bolus administration of
intravenous contrast.
CONTRAST:  85mL OMNIPAQUE IOHEXOL 300 MG/ML  SOLN

[Series 2: routine abd pel with · axial · 0.70mm/px · z∈[-458,-34]mm · 14 of 95 slices shown, 19 images]
[im 5/95  soft-tissue]
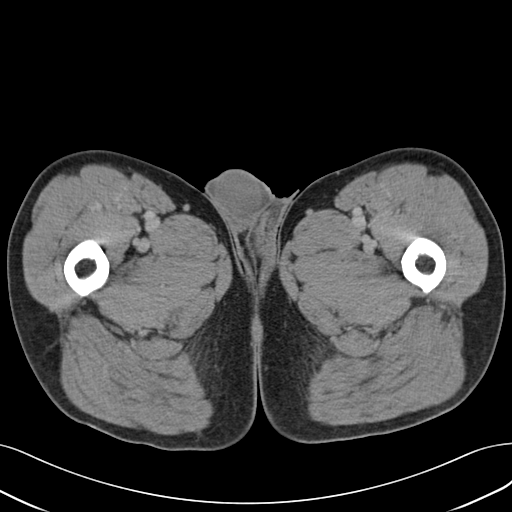
[im 5/95  bone]
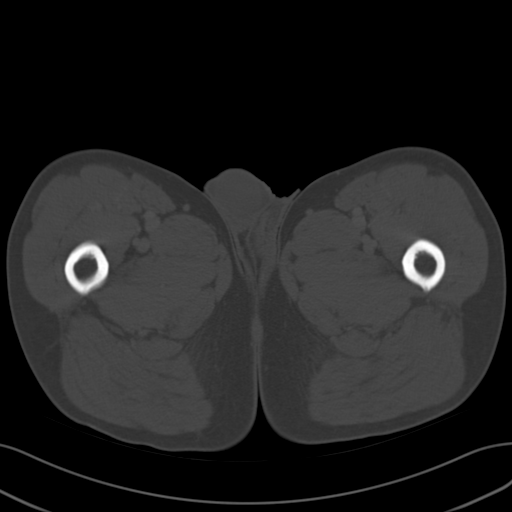
[im 15/95  soft-tissue]
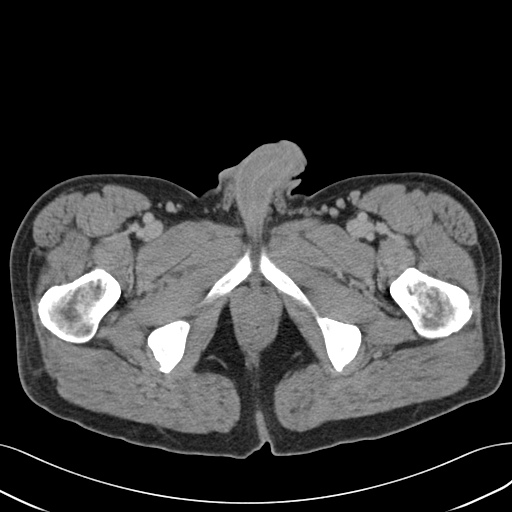
[im 20/95  soft-tissue]
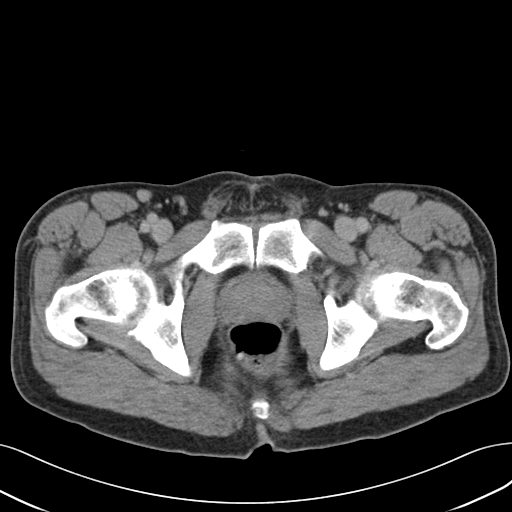
[im 25/95  soft-tissue]
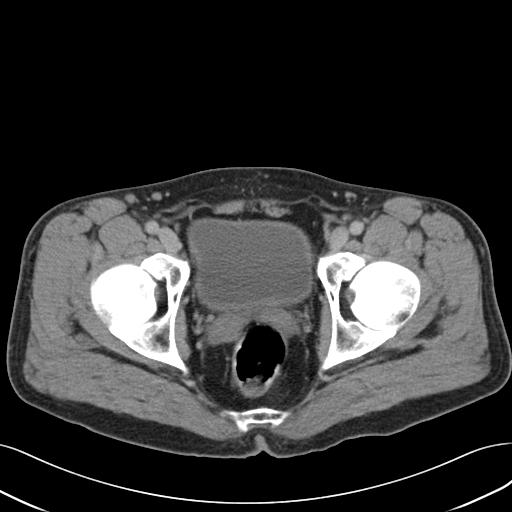
[im 35/95  soft-tissue]
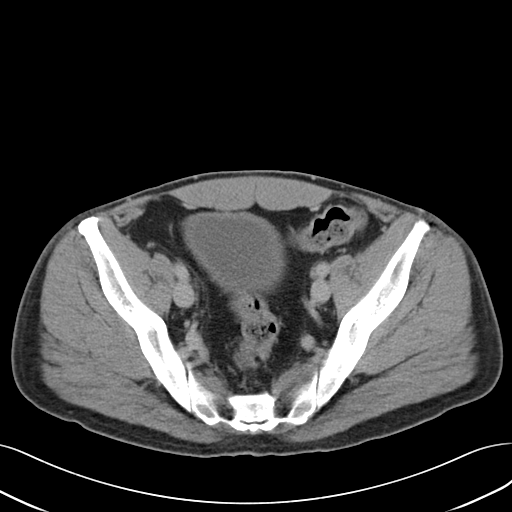
[im 40/95  soft-tissue]
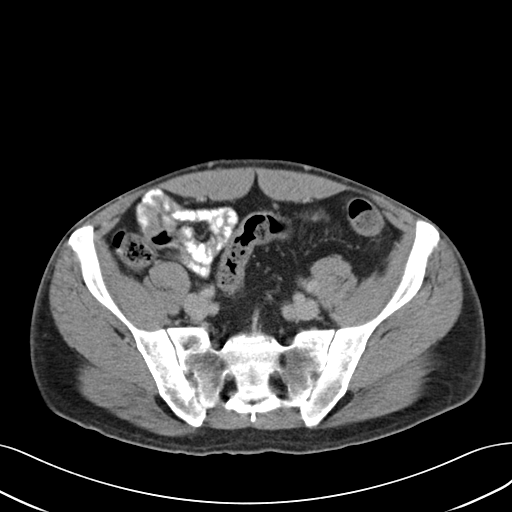
[im 50/95  soft-tissue]
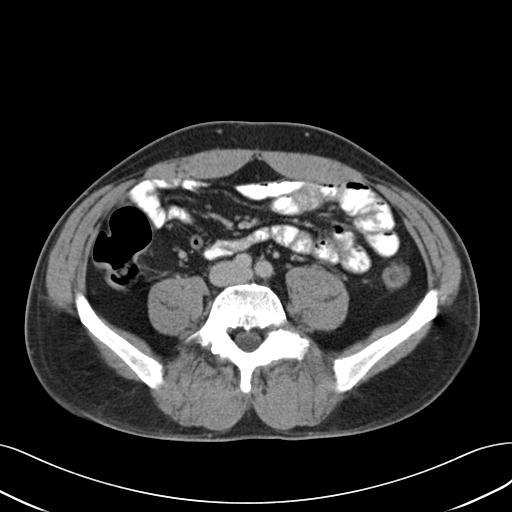
[im 55/95  soft-tissue]
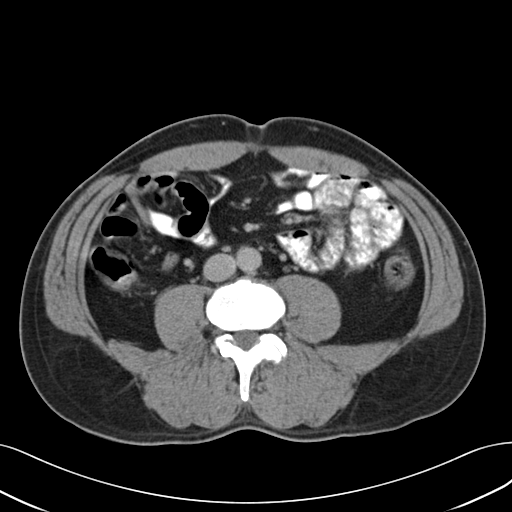
[im 60/95  soft-tissue]
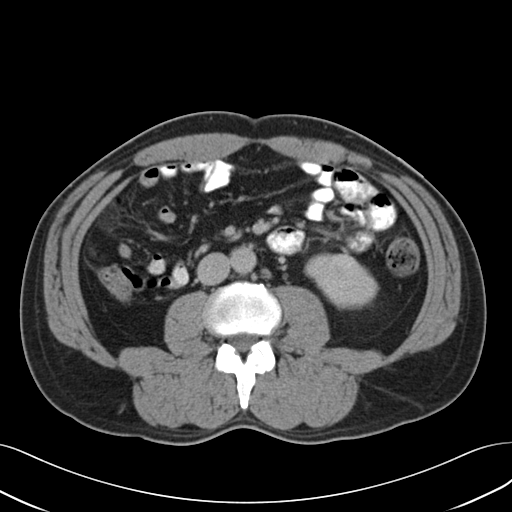
[im 60/95  bone]
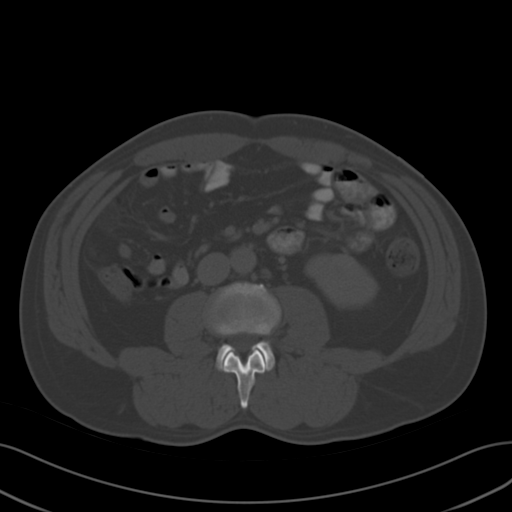
[im 70/95  soft-tissue]
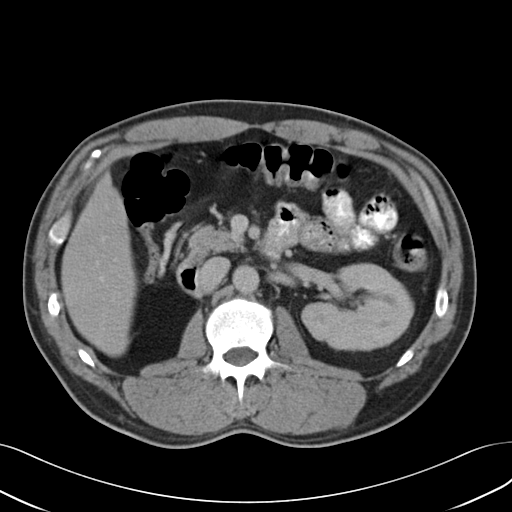
[im 75/95  soft-tissue]
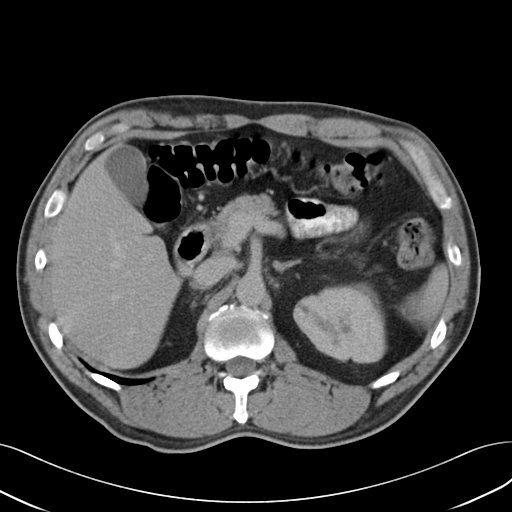
[im 75/95  lung]
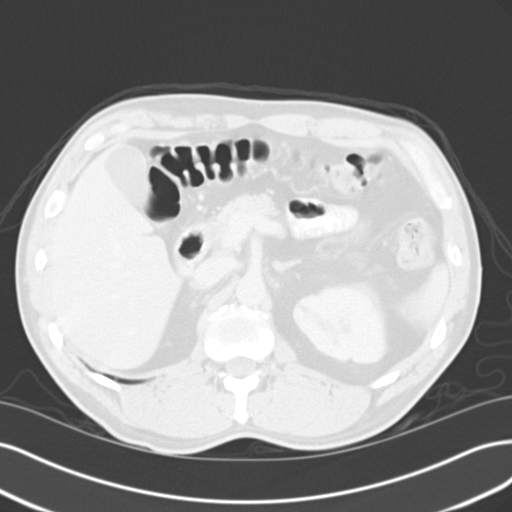
[im 80/95  soft-tissue]
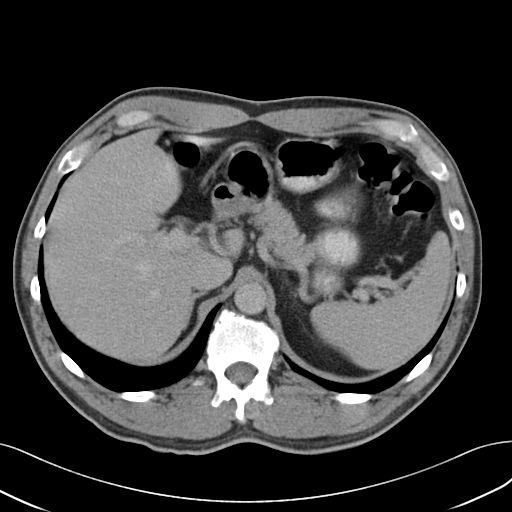
[im 80/95  lung]
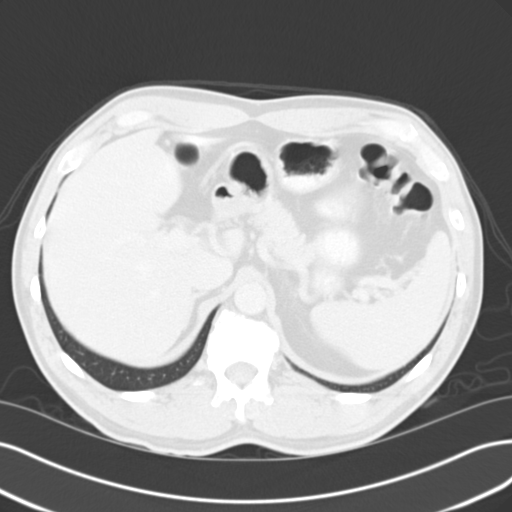
[im 85/95  lung]
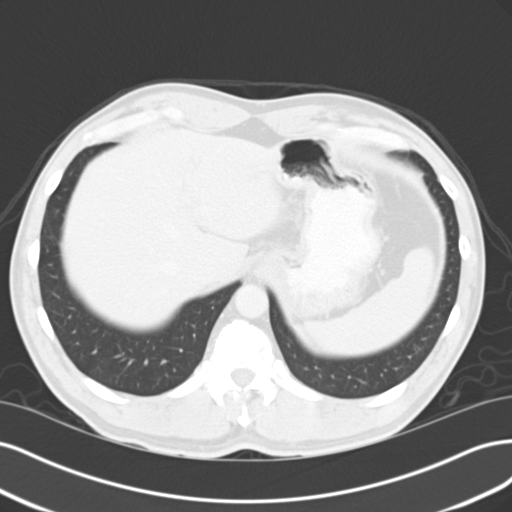
[im 90/95  soft-tissue]
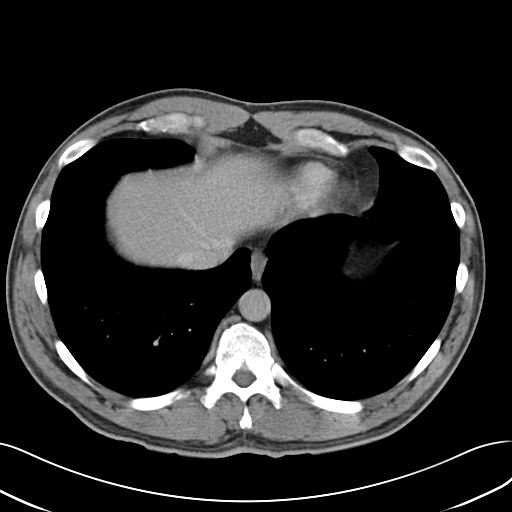
[im 90/95  lung]
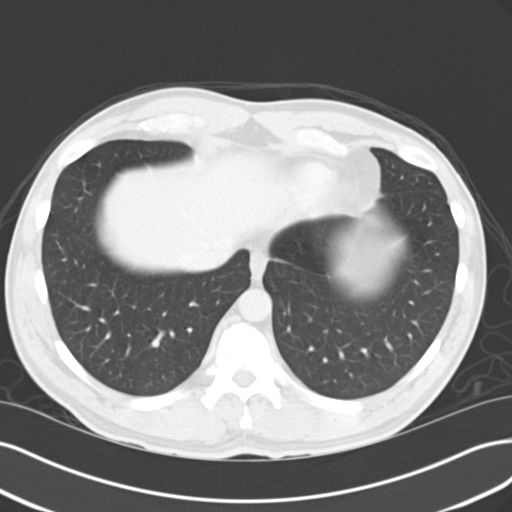

[14 of 32 positions shown; findings below may reference images not displayed]

FINDINGS: Normal lung bases.

There is a fat containing periumbilical hernia. No free air or free
fluid. Several small lesions in the left kidney are consistent with
tiny cysts but too small to characterize. No suspicious solid renal
lesions. No perinephric stranding or hydronephrosis. No
ureterectasis or ureteral stones. The patient is status post right
nephrectomy.

The liver, gallbladder, portal vein, adrenal glands, spleen, and
pancreas are normal. The stomach and small bowel are normal. The
colon and appendix are normal. The abdominal aorta is normal in
caliber. No adenopathy.

The pelvis demonstrates no adenopathy or mass. The bladder,
prostate, and seminal vesicles are normal.

Degenerative changes seen in the lower lumbar spine. No suspicious
bony lesions are identified.

Delayed images through the left kidney demonstrate no filling
defects within the renal collecting system or proximal ureter.
IMPRESSION: No cause for the patient's symptoms identified. No acute
abnormalities.

## 2017-08-28 ENCOUNTER — Encounter: Payer: 59 | Admitting: Internal Medicine

## 2018-01-06 ENCOUNTER — Encounter: Payer: Self-pay | Admitting: Internal Medicine

## 2018-01-06 ENCOUNTER — Ambulatory Visit (INDEPENDENT_AMBULATORY_CARE_PROVIDER_SITE_OTHER): Payer: 59 | Admitting: Internal Medicine

## 2018-01-06 VITALS — BP 124/80 | HR 53 | Ht 70.0 in | Wt 169.0 lb

## 2018-01-06 DIAGNOSIS — M94261 Chondromalacia, right knee: Secondary | ICD-10-CM

## 2018-01-06 DIAGNOSIS — Z125 Encounter for screening for malignant neoplasm of prostate: Secondary | ICD-10-CM

## 2018-01-06 DIAGNOSIS — L309 Dermatitis, unspecified: Secondary | ICD-10-CM | POA: Diagnosis not present

## 2018-01-06 DIAGNOSIS — Z0000 Encounter for general adult medical examination without abnormal findings: Secondary | ICD-10-CM

## 2018-01-06 LAB — POCT URINALYSIS DIPSTICK
BILIRUBIN UA: NEGATIVE
Blood, UA: NEGATIVE
Glucose, UA: NEGATIVE
KETONES UA: NEGATIVE
Leukocytes, UA: NEGATIVE
Nitrite, UA: NEGATIVE
PH UA: 5 (ref 5.0–8.0)
Protein, UA: NEGATIVE
Spec Grav, UA: <=1.005 — AB (ref 1.010–1.025)
UROBILINOGEN UA: 0.2 U/dL

## 2018-01-06 NOTE — Progress Notes (Signed)
Date:  01/06/2018   Name:  Brent Myers   DOB:  [birth year]   MRN:  [id number]   Chief Complaint: Annual Exam Brent Myers is a 47 y.o. male who presents today for his Complete Annual Exam. He feels well. He reports exercising regularly - with house remodeling. He reports he is sleeping fairly well. His eczema has been very mild.  No new issues.  HPI  Review of Systems  Constitutional: Negative for appetite change, chills, diaphoresis, fatigue and unexpected weight change.  HENT: Negative for hearing loss, tinnitus, trouble swallowing and voice change.   Eyes: Negative for visual disturbance.  Respiratory: Negative for choking, shortness of breath and wheezing.   Cardiovascular: Negative for chest pain, palpitations and leg swelling.  Gastrointestinal: Negative for abdominal pain, blood in stool, constipation and diarrhea.  Genitourinary: Negative for difficulty urinating, dysuria, frequency, hematuria and urgency.  Musculoskeletal: Positive for arthralgias (occassional right knee ache). Negative for back pain and myalgias.  Skin: Negative for color change and rash.  Allergic/Immunologic: Negative for environmental allergies.  Neurological: Negative for dizziness, syncope and headaches.  Hematological: Negative for adenopathy.  Psychiatric/Behavioral: Negative for dysphoric mood and sleep disturbance.    Patient Active Problem List   Diagnosis Date Noted  . Eczema 05/10/2015  . Gastroesophageal reflux disease 05/03/2015  . 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 Penicillins Other (See Comments)    Has patient had a PCN reaction causing immediate rash, facial/tongue/throat swelling, SOB or lightheadedness with hypotension: Unsure Has patient had a PCN reaction causing severe rash involving mucus membranes or skin necrosis: no Has patient had a PCN reaction that required hospitalization: no Has patient had a PCN reaction occurring within the last  10 years: no If all of the above answers are "NO", then may proceed with Cephalosporin use.   Marland Kitchen Sesame Seed (Diagnostic) Hives  . Shellfish Allergy     Past Surgical History:  Procedure Laterality Date  . NEPHRECTOMY LIVING DONOR Right 2005    Social History   Tobacco Use  . Smoking status: Never Smoker  . Smokeless tobacco: Never Used  Substance Use Topics  . Alcohol use: Yes    Alcohol/week: 4.0 standard drinks    Types: 4 Standard drinks or equivalent per week    Comment: rarely  . Drug use: No     Medication list has been reviewed and updated.  No outpatient medications have been marked as taking for the 01/06/18 encounter (Office Visit) with Glean Hess, MD.    [facility] 2/9 Scores 01/06/2018 02/26/2017 09/07/2015  PHQ - 2 Score 0 0 0    Physical Exam  Constitutional: He is oriented to person, place, and time. He appears well-developed and well-nourished.  HENT:  Head: Normocephalic.  Right Ear: Tympanic membrane, external ear and ear canal normal.  Left Ear: Tympanic membrane, external ear and ear canal normal.  Nose: Nose normal.  Mouth/Throat: Uvula is midline and oropharynx is clear and moist.  Eyes: Pupils are equal, round, and reactive to light. Conjunctivae and EOM are normal.  Neck: Normal range of motion. Neck supple. Carotid bruit is not present. No thyromegaly present.  Cardiovascular: Normal rate, regular rhythm, normal heart sounds and intact distal pulses.  Pulmonary/Chest: Effort normal and breath sounds normal. He has no wheezes. Right breast exhibits no mass. Left breast exhibits no mass.  Abdominal: Soft. Normal appearance and bowel sounds are normal. There is no hepatosplenomegaly. There is no  tenderness.  Musculoskeletal: Normal range of motion.       Right knee: He exhibits normal range of motion, no swelling and no effusion.       Left knee: He exhibits normal range of motion, no swelling and no effusion.  Lymphadenopathy:    He has no  cervical adenopathy.  Neurological: He is alert and oriented to person, place, and time. He has normal reflexes.  Skin: Skin is warm, dry and intact. Rash (eczema improved) noted.  Psychiatric: He has a normal mood and affect. His speech is normal and behavior is normal. Judgment and thought content normal.  Nursing note and vitals reviewed.   BP 124/80 (BP Location: Right Arm, Patient Position: Sitting, Cuff Size: Normal)   Pulse (!) 53   Ht 5\' 10"  (1.778 m)   Wt 169 lb (76.7 kg)   SpO2 100%   BMI 24.25 kg/m   Assessment and Plan: 1. Annual physical exam Normal exam - Lipid panel - TSH - POCT Urinalysis Dipstick  2. Prostate cancer screening DRE deferred - PSA  3. Eczema, unspecified type Continue clobetasol as needed - CBC with Differential/Platelet - Comprehensive metabolic panel  4. Chondromalacia of right knee Mild sx - continue supportive care   Partially dictated using Dragon software. Any errors are unintentional.  Halina Maidens, MD [facility]  01/06/2018

## 2018-01-06 NOTE — Patient Instructions (Signed)

## 2018-01-07 LAB — TSH: TSH: 2.08 u[IU]/mL (ref 0.450–4.500)

## 2018-01-07 LAB — LIPID PANEL
Chol/HDL Ratio: 2.7 ratio (ref 0.0–5.0)
Cholesterol, Total: 138 mg/dL (ref 100–199)
HDL: 52 mg/dL (ref >39)
LDL CALC: 77 mg/dL (ref 0–99)
TRIGLYCERIDES: 44 mg/dL (ref 0–149)
VLDL CHOLESTEROL CAL: 9 mg/dL (ref 5–40)

## 2018-01-07 LAB — CBC WITH DIFFERENTIAL/PLATELET
BASOS ABS: 0 10*3/uL (ref 0.0–0.2)
BASOS: 0 %
EOS (ABSOLUTE): 0.2 10*3/uL (ref 0.0–0.4)
Eos: 5 %
Hematocrit: 41.6 % (ref 37.5–51.0)
Hemoglobin: 13.9 g/dL (ref 13.0–17.7)
IMMATURE GRANS (ABS): 0 10*3/uL (ref 0.0–0.1)
Immature Granulocytes: 0 %
LYMPHS ABS: 1.2 10*3/uL (ref 0.7–3.1)
LYMPHS: 25 %
MCH: 30.3 pg (ref 26.6–33.0)
MCHC: 33.4 g/dL (ref 31.5–35.7)
MCV: 91 fL (ref 79–97)
Monocytes Absolute: 0.3 10*3/uL (ref 0.1–0.9)
Monocytes: 7 %
NEUTROS ABS: 2.9 10*3/uL (ref 1.4–7.0)
Neutrophils: 63 %
Platelets: 224 10*3/uL (ref 150–450)
RBC: 4.58 x10E6/uL (ref 4.14–5.80)
RDW: 12.9 % (ref 12.3–15.4)
WBC: 4.6 10*3/uL (ref 3.4–10.8)

## 2018-01-07 LAB — COMPREHENSIVE METABOLIC PANEL
ALBUMIN: 4.5 g/dL (ref 3.5–5.5)
ALT: 15 IU/L (ref 0–44)
AST: 16 IU/L (ref 0–40)
Albumin/Globulin Ratio: 2 (ref 1.2–2.2)
Alkaline Phosphatase: 86 IU/L (ref 39–117)
BUN/Creatinine Ratio: 12 (ref 9–20)
BUN: 14 mg/dL (ref 6–24)
Bilirubin Total: 0.8 mg/dL (ref 0.0–1.2)
CALCIUM: 9.5 mg/dL (ref 8.7–10.2)
CHLORIDE: 102 mmol/L (ref 96–106)
CO2: 23 mmol/L (ref 20–29)
CREATININE: 1.15 mg/dL (ref 0.76–1.27)
GFR calc Af Amer: 87 mL/min/{1.73_m2} (ref >59)
GFR calc non Af Amer: 75 mL/min/{1.73_m2} (ref >59)
Globulin, Total: 2.3 g/dL (ref 1.5–4.5)
Glucose: 65 mg/dL (ref 65–99)
Potassium: 4.4 mmol/L (ref 3.5–5.2)
Sodium: 141 mmol/L (ref 134–144)
Total Protein: 6.8 g/dL (ref 6.0–8.5)

## 2018-01-07 LAB — PSA: Prostate Specific Ag, Serum: 2.7 ng/mL (ref 0.0–4.0)

## 2018-01-23 ENCOUNTER — Other Ambulatory Visit: Payer: Self-pay | Admitting: Internal Medicine

## 2018-01-23 DIAGNOSIS — L309 Dermatitis, unspecified: Secondary | ICD-10-CM

## 2018-02-02 ENCOUNTER — Encounter: Payer: Self-pay | Admitting: Emergency Medicine

## 2018-02-02 ENCOUNTER — Ambulatory Visit
Admission: EM | Admit: 2018-02-02 | Discharge: 2018-02-02 | Disposition: A | Discharge status: Home / Self Care | Payer: 59 | Attending: Emergency Medicine | Admitting: Emergency Medicine

## 2018-02-02 ENCOUNTER — Other Ambulatory Visit: Payer: Self-pay

## 2018-02-02 DIAGNOSIS — R21 Rash and other nonspecific skin eruption: Secondary | ICD-10-CM

## 2018-02-02 MED ORDER — PREDNISONE 10 MG (21) PO TBPK: ORAL_TABLET | ORAL | 0 refills | Status: DC

## 2018-02-02 MED ORDER — HYDROXYZINE HCL 25 MG PO TABS: 25.0000 mg | ORAL_TABLET | Freq: Four times a day (QID) | ORAL | 0 refills | Status: AC | PRN

## 2018-02-02 NOTE — ED Provider Notes (Signed)
HPI  SUBJECTIVE:  Brent Myers is a 47 y.o. male who presents with erythematous, itchy rash in his axilla, over his upper arms, forearms, palms for the past 2 weeks.  States that it is getting bigger.  States that it extended to his right palm today.  He has been using a new deodorant, but discontinued this 10 days ago because he thought that this may be contributing to the rash.  States that the rash has continued to get worse.  He states that the itching is worse at night when he tries to go to sleep and in the morning.  He is renovating his house, but denies any exposure to solvents.  No new lotions, soaps, detergents, diet changes, recent antibiotics, he does not take any medications on a regular basis.  No contacts with a similar rash.  No sensation of being bitten at night, blood in the bed clothes in the morning.  No fevers, flulike symptoms.  No recent yardwork.  No wheezing, shortness of breath, abdominal pain, diarrhea, body aches, joint aches.  He tried hydrocortisone with improvement in the itching, clobetasol.  Symptoms are worse with sweating.  He has a past medical history of eczema and food allergies.  No history of diabetes, hypertension.  FTD:DUKGURKY, Jesse Sans, MD  Past Medical History:  Diagnosis Date  . Acid reflux   . Eczema     Past Surgical History:  Procedure Laterality Date  . NEPHRECTOMY LIVING DONOR Right 2005    Family History  Problem Relation Age of Onset  . Renal Disease Mother   . Thyroid disease Father   . Pancreatic cancer Maternal Grandmother     Social History   Tobacco Use  . Smoking status: Never Smoker  . Smokeless tobacco: Never Used  Substance Use Topics  . Alcohol use: Yes    Alcohol/week: 4.0 standard drinks    Types: 4 Standard drinks or equivalent per week    Comment: rarely  . Drug use: No    No current facility-administered medications for this encounter.   Current Outpatient Medications:  .  clobetasol cream (TEMOVATE) 0.05 %,  APPLY EXTERNALLY TO THE AFFECTED AREA TWICE DAILY, Disp: 30 g, Rfl: 0 .  hydrOXYzine (ATARAX/VISTARIL) 25 MG tablet, Take 1 tablet (25 mg total) by mouth every 6 (six) hours as needed for itching., Disp: 20 tablet, Rfl: 0 .  predniSONE (STERAPRED UNI-PAK 21 TAB) 10 MG (21) TBPK tablet, Dispense one 6 day pack. Take as directed with food., Disp: 21 tablet, Rfl: 0  Allergies  Allergen Reactions  . Peanut-Containing Drug Products   . Penicillins Other (See Comments)    Has patient had a PCN reaction causing immediate rash, facial/tongue/throat swelling, SOB or lightheadedness with hypotension: Unsure Has patient had a PCN reaction causing severe rash involving mucus membranes or skin necrosis: no Has patient had a PCN reaction that required hospitalization: no Has patient had a PCN reaction occurring within the last 10 years: no If all of the above answers are "NO", then may proceed with Cephalosporin use.   Marland Kitchen Sesame Seed (Diagnostic) Hives  . Shellfish Allergy      ROS  As noted in HPI.   Physical Exam  BP 127/87 (BP Location: Left Arm)   Pulse (!) 50   Temp 98.1 F (36.7 C) (Oral)   Resp 16   Ht 5\' 10"  (1.778 m)   Wt 76.2 kg   SpO2 100%   BMI 24.11 kg/m   Constitutional: Well developed,  well nourished, no acute distress Eyes:  EOMI, conjunctiva normal bilaterally HENT: Normocephalic, atraumatic,mucus membranes moist Respiratory: Normal inspiratory effort Cardiovascular: Normal rate GI: nondistended lymph: No cervical axillary lymphadenopathy skin: Erythematous, blanchable, nontender maculopapular rash in the axilla, bilateral forearms, back.  Dry skin in axilla. no crusting, blisters.  None over the buttocks, lower extremities.  Questionable rash left palm.  See pictures.               Musculoskeletal: no deformities Neurologic: Alert & oriented x 3, no focal neuro deficits Psychiatric: Speech and behavior appropriate   ED Course   Medications - No  data to display  No orders of the defined types were placed in this encounter.   No results found for this or any previous visit (from the past 24 hour(s)). No results found.  ED Clinical Impression  Rash   ED Assessment/Plan  Appears to be a contact dermatitis of some sort, doubt scabies, poison ivy/poison oak, it is not shingles,does not appear to be a drug rash, it is not infected.  I am not convinced that the rash on the palm of his hand is connected with the rash that he has elsewhere.  No evidence of life-threatening emergency at this time.  Will send home with a 6-day prednisone taper, try Claritin or Zyrtec, Atarax if the Claritin or Zyrtec does not work, follow-up with Dr. Aubery Lapping, dermatology if not better in 1 to 2 weeks.    Discussed  MDM, treatment plan, and plan for follow-up with patient.  patient agrees with plan.   Meds ordered this encounter  Medications  . hydrOXYzine (ATARAX/VISTARIL) 25 MG tablet    Sig: Take 1 tablet (25 mg total) by mouth every 6 (six) hours as needed for itching.    Dispense:  20 tablet    Refill:  0  . predniSONE (STERAPRED UNI-PAK 21 TAB) 10 MG (21) TBPK tablet    Sig: Dispense one 6 day pack. Take as directed with food.    Dispense:  21 tablet    Refill:  0    *This clinic note was created using [employer]. Therefore, there may be occasional mistakes despite careful proofreading.   ?   Melynda Ripple, MD 02/04/18 [phone]

## 2018-02-02 NOTE — Discharge Instructions (Signed)
6-day prednisone taper, try Claritin or Zyrtec, Atarax if the Claritin or Zyrtec does not work, follow-up with Dr. Aubery Lapping, dermatology if not better in 1 to 2 weeks.  He can try and a drill, Unisom or melatonin to help you sleep if you have difficulty sleeping.

## 2018-02-02 NOTE — ED Triage Notes (Signed)
Pt c/o itchy, red rash on both of his arms from the axilla to the palms of his hands. Started about 2 weeks ago. He reports that he tried a steroid cream that he had at home for them but it did not help.he reports that he used a different deodorant and he stopped it but the rash is still there.

## 2018-04-27 DIAGNOSIS — L2089 Other atopic dermatitis: Secondary | ICD-10-CM | POA: Diagnosis not present

## 2018-04-27 DIAGNOSIS — L249 Irritant contact dermatitis, unspecified cause: Secondary | ICD-10-CM | POA: Diagnosis not present

## 2018-05-29 DIAGNOSIS — M9903 Segmental and somatic dysfunction of lumbar region: Secondary | ICD-10-CM | POA: Diagnosis not present

## 2018-05-29 DIAGNOSIS — M9901 Segmental and somatic dysfunction of cervical region: Secondary | ICD-10-CM | POA: Diagnosis not present

## 2018-05-29 DIAGNOSIS — M9902 Segmental and somatic dysfunction of thoracic region: Secondary | ICD-10-CM | POA: Diagnosis not present

## 2018-06-09 DIAGNOSIS — M9901 Segmental and somatic dysfunction of cervical region: Secondary | ICD-10-CM | POA: Diagnosis not present

## 2018-06-09 DIAGNOSIS — M9903 Segmental and somatic dysfunction of lumbar region: Secondary | ICD-10-CM | POA: Diagnosis not present

## 2018-06-09 DIAGNOSIS — M9902 Segmental and somatic dysfunction of thoracic region: Secondary | ICD-10-CM | POA: Diagnosis not present

## 2018-07-16 DIAGNOSIS — M9902 Segmental and somatic dysfunction of thoracic region: Secondary | ICD-10-CM | POA: Diagnosis not present

## 2018-07-16 DIAGNOSIS — M9903 Segmental and somatic dysfunction of lumbar region: Secondary | ICD-10-CM | POA: Diagnosis not present

## 2018-07-16 DIAGNOSIS — M9901 Segmental and somatic dysfunction of cervical region: Secondary | ICD-10-CM | POA: Diagnosis not present

## 2018-07-29 DIAGNOSIS — L2089 Other atopic dermatitis: Secondary | ICD-10-CM | POA: Diagnosis not present

## 2018-07-29 DIAGNOSIS — R21 Rash and other nonspecific skin eruption: Secondary | ICD-10-CM | POA: Diagnosis not present

## 2018-11-02 ENCOUNTER — Ambulatory Visit: Payer: 59 | Admitting: Internal Medicine

## 2019-01-08 ENCOUNTER — Encounter: Payer: 59 | Admitting: Internal Medicine

## 2019-01-14 ENCOUNTER — Ambulatory Visit (INDEPENDENT_AMBULATORY_CARE_PROVIDER_SITE_OTHER): Payer: 59 | Admitting: Internal Medicine

## 2019-01-14 ENCOUNTER — Encounter: Payer: Self-pay | Admitting: Internal Medicine

## 2019-01-14 ENCOUNTER — Other Ambulatory Visit: Payer: Self-pay

## 2019-01-14 VITALS — BP 128/72 | HR 71 | Ht 70.0 in | Wt 176.0 lb

## 2019-01-14 DIAGNOSIS — Z0000 Encounter for general adult medical examination without abnormal findings: Secondary | ICD-10-CM

## 2019-01-14 DIAGNOSIS — L309 Dermatitis, unspecified: Secondary | ICD-10-CM | POA: Diagnosis not present

## 2019-01-14 DIAGNOSIS — Z125 Encounter for screening for malignant neoplasm of prostate: Secondary | ICD-10-CM | POA: Diagnosis not present

## 2019-01-14 DIAGNOSIS — Z1211 Encounter for screening for malignant neoplasm of colon: Secondary | ICD-10-CM | POA: Diagnosis not present

## 2019-01-14 LAB — POCT URINALYSIS DIPSTICK
Bilirubin, UA: NEGATIVE
Blood, UA: NEGATIVE
Glucose, UA: NEGATIVE
Ketones, UA: NEGATIVE
Leukocytes, UA: NEGATIVE
Nitrite, UA: NEGATIVE
Protein, UA: NEGATIVE
Spec Grav, UA: 1.01 (ref 1.010–1.025)
Urobilinogen, UA: 0.2 E.U./dL
pH, UA: 6.5 (ref 5.0–8.0)

## 2019-01-14 NOTE — Progress Notes (Signed)
Date:  01/14/2019   Name:  Brent Myers   DOB:  February 02, 1971   MRN:  [id number]   Chief Complaint: No chief complaint on file. Brent Myers is a 48 y.o. male who presents today for his Complete Annual Exam. He feels well. He reports exercising regularly. He reports he is sleeping well.   Colonoscopy - due soon Tdap 2017 He declined flu vaccine  Rash This is a recurrent problem. The problem has been gradually worsening since onset. The affected locations include the abdomen, right axilla and left axilla. The rash is characterized by redness and itchiness. Pertinent negatives include no diarrhea, fatigue or shortness of breath. Past treatments include topical steroids. The treatment provided moderate relief.    Review of Systems  Constitutional: Negative for appetite change, chills, diaphoresis, fatigue and unexpected weight change.  HENT: Negative for hearing loss, tinnitus, trouble swallowing and voice change.   Eyes: Negative for visual disturbance.  Respiratory: Negative for choking, shortness of breath and wheezing.   Cardiovascular: Negative for chest pain, palpitations and leg swelling.  Gastrointestinal: Negative for abdominal pain, blood in stool, constipation and diarrhea.  Genitourinary: Negative for difficulty urinating, dysuria, frequency, hematuria, testicular pain and urgency.  Musculoskeletal: Negative for arthralgias, back pain and myalgias.  Skin: Positive for rash. Negative for color change.  Allergic/Immunologic: Negative for environmental allergies.  Neurological: Negative for dizziness, syncope and headaches.  Hematological: Negative for adenopathy.  Psychiatric/Behavioral: Negative for dysphoric mood and sleep disturbance.    Patient Active Problem List   Diagnosis Date Noted  . 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 Eczema 05/10/2015  . Gastroesophageal reflux disease 05/03/2015    Allergies  Allergen Reactions  . Peanut-Containing Drug Products    . Penicillins Other (See Comments)    Has patient had a PCN reaction causing immediate rash, facial/tongue/throat swelling, SOB or lightheadedness with hypotension: Unsure Has patient had a PCN reaction causing severe rash involving mucus membranes or skin necrosis: no Has patient had a PCN reaction that required hospitalization: no Has patient had a PCN reaction occurring within the last 10 years: no If all of the above answers are "NO", then may proceed with Cephalosporin use.   Marland Kitchen Sesame Seed (Diagnostic) Hives  . Shellfish Allergy     Past Surgical History:  Procedure Laterality Date  . NEPHRECTOMY LIVING DONOR Right 2005    Social History   Tobacco Use  . Smoking status: Never Smoker  . Smokeless tobacco: Never Used  Substance Use Topics  . Alcohol use: Yes    Alcohol/week: 4.0 standard drinks    Types: 4 Standard drinks or equivalent per week    Comment: rarely  . Drug use: No     Medication list has been reviewed and updated.  Current Meds  Medication Sig  . ammonium lactate (AMLACTIN) 12 % cream   . clobetasol cream (TEMOVATE) 0.05 % APPLY EXTERNALLY TO THE AFFECTED AREA TWICE DAILY  . hydrOXYzine (ATARAX/VISTARIL) 25 MG tablet Take 1 tablet (25 mg total) by mouth every 6 (six) hours as needed for itching.    PHQ 2/9 Scores 01/14/2019 01/06/2018 02/26/2017 09/07/2015  PHQ - 2 Score 0 0 0 0    BP Readings from Last 3 Encounters:  01/14/19 128/72  02/02/18 127/87  01/06/18 124/80    Physical Exam Vitals signs and nursing note reviewed.  Constitutional:      Appearance: Normal appearance. He is well-developed.  HENT:     Head: Normocephalic.  Right Ear: Tympanic membrane, ear canal and external ear normal.     Left Ear: Tympanic membrane, ear canal and external ear normal.     Nose: Nose normal.     Mouth/Throat:     Pharynx: Uvula midline.  Eyes:     Conjunctiva/sclera: Conjunctivae normal.     Pupils: Pupils are equal, round, and reactive to  light.  Neck:     Musculoskeletal: Normal range of motion and neck supple.     Thyroid: No thyromegaly.     Vascular: No carotid bruit.  Cardiovascular:     Rate and Rhythm: Normal rate and regular rhythm.     Heart sounds: Normal heart sounds.  Pulmonary:     Effort: Pulmonary effort is normal.     Breath sounds: Normal breath sounds. No wheezing.  Chest:     Breasts:        Right: No mass.        Left: No mass.  Abdominal:     General: Bowel sounds are normal.     Palpations: Abdomen is soft.     Tenderness: There is no abdominal tenderness.  Musculoskeletal: Normal range of motion.     Right lower leg: No edema.     Left lower leg: No edema.  Lymphadenopathy:     Cervical: No cervical adenopathy.  Skin:    General: Skin is warm and dry.     Capillary Refill: Capillary refill takes less than 2 seconds.  Neurological:     General: No focal deficit present.     Mental Status: He is alert and oriented to person, place, and time.     Deep Tendon Reflexes: Reflexes are normal and symmetric.  Psychiatric:        Attention and Perception: Attention normal.        Mood and Affect: Mood normal.        Speech: Speech normal.        Behavior: Behavior normal.        Thought Content: Thought content normal.        Judgment: Judgment normal.     Wt Readings from Last 3 Encounters:  01/14/19 176 lb (79.8 kg)  02/02/18 168 lb (76.2 kg)  01/06/18 169 lb (76.7 kg)    BP 128/72   Pulse 71   Ht 5\' 10"  (1.778 m)   Wt 176 lb (79.8 kg)   SpO2 100%   BMI 25.25 kg/m   Assessment and Plan: 1. Annual physical exam Normal exam Continue healthy diet, exercise - CBC with Differential/Platelet - Comprehensive metabolic panel - Lipid panel - POCT urinalysis dipstick  2. Colon cancer screening Due at age 61 next month - Ambulatory referral to Gastroenterology  3. Eczema, unspecified type Call back with topical steroid for refill if needed  4. Prostate cancer screening DRE  deferred to lack of sx - PSA   Partially dictated using Dragon software. Any errors are unintentional.  Halina Maidens, MD [facility]  01/14/2019

## 2019-01-14 NOTE — Patient Instructions (Signed)

## 2019-01-15 LAB — CBC WITH DIFFERENTIAL/PLATELET
Basophils Absolute: 0 10*3/uL (ref 0.0–0.2)
Basos: 1 %
EOS (ABSOLUTE): 0.5 10*3/uL — ABNORMAL HIGH (ref 0.0–0.4)
Eos: 10 %
Hematocrit: 42.6 % (ref 37.5–51.0)
Hemoglobin: 14.8 g/dL (ref 13.0–17.7)
Immature Grans (Abs): 0 10*3/uL (ref 0.0–0.1)
Immature Granulocytes: 0 %
Lymphocytes Absolute: 1.2 10*3/uL (ref 0.7–3.1)
Lymphs: 24 %
MCH: 30.7 pg (ref 26.6–33.0)
MCHC: 34.7 g/dL (ref 31.5–35.7)
MCV: 88 fL (ref 79–97)
Monocytes Absolute: 0.4 10*3/uL (ref 0.1–0.9)
Monocytes: 9 %
Neutrophils Absolute: 2.8 10*3/uL (ref 1.4–7.0)
Neutrophils: 56 %
Platelets: 222 10*3/uL (ref 150–450)
RBC: 4.82 x10E6/uL (ref 4.14–5.80)
RDW: 12 % (ref 11.6–15.4)
WBC: 4.9 10*3/uL (ref 3.4–10.8)

## 2019-01-15 LAB — COMPREHENSIVE METABOLIC PANEL
ALT: 13 IU/L (ref 0–44)
AST: 22 IU/L (ref 0–40)
Albumin/Globulin Ratio: 2 (ref 1.2–2.2)
Albumin: 4.5 g/dL (ref 4.0–5.0)
Alkaline Phosphatase: 91 IU/L (ref 39–117)
BUN/Creatinine Ratio: 10 (ref 9–20)
BUN: 13 mg/dL (ref 6–24)
Bilirubin Total: 0.8 mg/dL (ref 0.0–1.2)
CO2: 26 mmol/L (ref 20–29)
Calcium: 9.7 mg/dL (ref 8.7–10.2)
Chloride: 103 mmol/L (ref 96–106)
Creatinine, Ser: 1.27 mg/dL (ref 0.76–1.27)
GFR calc Af Amer: 76 mL/min/{1.73_m2} (ref >59)
GFR calc non Af Amer: 66 mL/min/{1.73_m2} (ref >59)
Globulin, Total: 2.3 g/dL (ref 1.5–4.5)
Glucose: 74 mg/dL (ref 65–99)
Potassium: 4.8 mmol/L (ref 3.5–5.2)
Sodium: 139 mmol/L (ref 134–144)
Total Protein: 6.8 g/dL (ref 6.0–8.5)

## 2019-01-15 LAB — LIPID PANEL
Chol/HDL Ratio: 3 ratio (ref 0.0–5.0)
Cholesterol, Total: 151 mg/dL (ref 100–199)
HDL: 51 mg/dL (ref >39)
LDL Chol Calc (NIH): 89 mg/dL (ref 0–99)
Triglycerides: 50 mg/dL (ref 0–149)
VLDL Cholesterol Cal: 11 mg/dL (ref 5–40)

## 2019-01-15 LAB — PSA: Prostate Specific Ag, Serum: 2.4 ng/mL (ref 0.0–4.0)

## 2019-01-21 ENCOUNTER — Telehealth: Payer: Self-pay

## 2019-01-21 ENCOUNTER — Other Ambulatory Visit: Payer: Self-pay

## 2019-01-21 DIAGNOSIS — Z8371 Family history of colonic polyps: Secondary | ICD-10-CM

## 2019-01-21 DIAGNOSIS — Z1211 Encounter for screening for malignant neoplasm of colon: Secondary | ICD-10-CM

## 2019-01-21 MED ORDER — NA SULFATE-K SULFATE-MG SULF 17.5-3.13-1.6 GM/177ML PO SOLN: 1.0000 | Freq: Once | ORAL | 0 refills | Status: AC

## 2019-01-21 NOTE — Telephone Encounter (Signed)
Gastroenterology Pre-Procedure Review  Request Date: Tuesday 02/02/19 Requesting Physician: Dr. Vicente Males  PATIENT REVIEW QUESTIONS: The patient responded to the following health history questions as indicated:    1. Are you having any GI issues? no 2. Do you have a personal history of Polyps? no 3. Do you have a family history of Colon Cancer or Polyps? yes (mothers side of family) 4. Diabetes Mellitus? no 5. Joint replacements in the past 12 months?no 6. Major health problems in the past 3 months?no 7. Any artificial heart valves, MVP, or defibrillator?no    MEDICATIONS & ALLERGIES:    Patient reports the following regarding taking any anticoagulation/antiplatelet therapy:   Plavix, Coumadin, Eliquis, Xarelto, Lovenox, Pradaxa, Brilinta, or Effient? no Aspirin? no  Patient confirms/reports the following medications:  Current Outpatient Medications  Medication Sig Dispense Refill  . ammonium lactate (AMLACTIN) 12 % cream     . clobetasol cream (TEMOVATE) 0.05 % APPLY EXTERNALLY TO THE AFFECTED AREA TWICE DAILY 30 g 0  . hydrOXYzine (ATARAX/VISTARIL) 25 MG tablet Take 1 tablet (25 mg total) by mouth every 6 (six) hours as needed for itching. 20 tablet 0   No current facility-administered medications for this visit.     Patient confirms/reports the following allergies:  Allergies  Allergen Reactions  . Peanut-Containing Drug Products   . Penicillins Other (See Comments)    Has patient had a PCN reaction causing immediate rash, facial/tongue/throat swelling, SOB or lightheadedness with hypotension: Unsure Has patient had a PCN reaction causing severe rash involving mucus membranes or skin necrosis: no Has patient had a PCN reaction that required hospitalization: no Has patient had a PCN reaction occurring within the last 10 years: no If all of the above answers are "NO", then may proceed with Cephalosporin use.   Marland Kitchen Sesame Seed (Diagnostic) Hives  . Shellfish Allergy     No  orders of the defined types were placed in this encounter.   AUTHORIZATION INFORMATION Primary Insurance: 1D#: Group #:  Secondary Insurance: 1D#: Group #:  SCHEDULE INFORMATION: Date: 02/02/19 Time: Location:[facility]

## 2019-01-29 ENCOUNTER — Other Ambulatory Visit: Payer: Self-pay

## 2019-01-29 ENCOUNTER — Other Ambulatory Visit
Admission: RE | Admit: 2019-01-29 | Discharge: 2019-01-29 | Disposition: A | Discharge status: Home / Self Care | Payer: Commercial Managed Care - PPO | Source: Ambulatory Visit | Attending: Gastroenterology | Admitting: Gastroenterology

## 2019-01-29 DIAGNOSIS — Z01812 Encounter for preprocedural laboratory examination: Secondary | ICD-10-CM | POA: Insufficient documentation

## 2019-01-29 DIAGNOSIS — Z20828 Contact with and (suspected) exposure to other viral communicable diseases: Secondary | ICD-10-CM | POA: Diagnosis not present

## 2019-01-29 DIAGNOSIS — Z20822 Contact with and (suspected) exposure to covid-19: Secondary | ICD-10-CM

## 2019-01-29 LAB — SARS CORONAVIRUS 2 (TAT 6-24 HRS): SARS Coronavirus 2: NEGATIVE

## 2019-02-01 ENCOUNTER — Encounter: Payer: Self-pay | Admitting: *Deleted

## 2019-02-01 ENCOUNTER — Telehealth: Payer: Self-pay | Admitting: Gastroenterology

## 2019-02-01 NOTE — Telephone Encounter (Signed)
Pt left vm returning someone's call pt has a procedure tomorrow

## 2019-02-01 NOTE — Telephone Encounter (Signed)
Returned patients call.  LVM for him to call office back.  Thanks [employer]

## 2019-02-01 NOTE — Telephone Encounter (Signed)
Pt is calling he drank a smothie today  And has a procedure tomorrow and needs to know if he needs to r/s?

## 2019-02-01 NOTE — Telephone Encounter (Signed)
Pt left vm he has a procedure tomorrow and may have drank something that was not on the list please call pt

## 2019-02-01 NOTE — Telephone Encounter (Signed)
Spoke with pt regarding prep for procedure. Pt states he drunk a chocolate smoothie around lunch time today and is concerned about not being cleaned for colonoscopy. I explained that more than likely he would be okay as long as he followed the clear liquid diet the rest of the day. Pt did not want to take the chance of having to repeat the colonoscopy so he decided to reschedule to next week as no other day this week is suitable. We have rescheduled pt to 02-11-19 pt is aware that he'll need to repeat the COVID test. [facility] has been notified.

## 2019-02-08 ENCOUNTER — Other Ambulatory Visit
Admission: RE | Admit: 2019-02-08 | Discharge: 2019-02-08 | Disposition: A | Discharge status: Home / Self Care | Payer: Commercial Managed Care - PPO | Source: Ambulatory Visit | Attending: Gastroenterology | Admitting: Gastroenterology

## 2019-02-08 DIAGNOSIS — Z01812 Encounter for preprocedural laboratory examination: Secondary | ICD-10-CM | POA: Insufficient documentation

## 2019-02-08 DIAGNOSIS — Z20828 Contact with and (suspected) exposure to other viral communicable diseases: Secondary | ICD-10-CM | POA: Diagnosis not present

## 2019-02-08 LAB — SARS CORONAVIRUS 2 (TAT 6-24 HRS): SARS Coronavirus 2: NEGATIVE

## 2019-02-11 ENCOUNTER — Encounter
Admission: RE | Disposition: A | Discharge status: Home / Self Care | Payer: Self-pay | Source: Home / Self Care | Attending: Gastroenterology

## 2019-02-11 ENCOUNTER — Ambulatory Visit: Payer: Commercial Managed Care - PPO | Admitting: Registered Nurse

## 2019-02-11 ENCOUNTER — Ambulatory Visit
Admission: RE | Admit: 2019-02-11 | Discharge: 2019-02-11 | Disposition: A | Discharge status: Home / Self Care | Payer: Commercial Managed Care - PPO | Attending: Gastroenterology | Admitting: Gastroenterology

## 2019-02-11 ENCOUNTER — Other Ambulatory Visit: Payer: Self-pay

## 2019-02-11 DIAGNOSIS — Z91010 Allergy to peanuts: Secondary | ICD-10-CM | POA: Insufficient documentation

## 2019-02-11 DIAGNOSIS — D125 Benign neoplasm of sigmoid colon: Secondary | ICD-10-CM | POA: Insufficient documentation

## 2019-02-11 DIAGNOSIS — Z91013 Allergy to seafood: Secondary | ICD-10-CM | POA: Diagnosis not present

## 2019-02-11 DIAGNOSIS — K635 Polyp of colon: Secondary | ICD-10-CM | POA: Diagnosis not present

## 2019-02-11 DIAGNOSIS — Z1211 Encounter for screening for malignant neoplasm of colon: Secondary | ICD-10-CM | POA: Diagnosis not present

## 2019-02-11 DIAGNOSIS — K640 First degree hemorrhoids: Secondary | ICD-10-CM | POA: Insufficient documentation

## 2019-02-11 DIAGNOSIS — Z800 Family history of malignant neoplasm of digestive organs: Secondary | ICD-10-CM | POA: Diagnosis not present

## 2019-02-11 DIAGNOSIS — D122 Benign neoplasm of ascending colon: Secondary | ICD-10-CM | POA: Insufficient documentation

## 2019-02-11 DIAGNOSIS — Z91018 Allergy to other foods: Secondary | ICD-10-CM | POA: Insufficient documentation

## 2019-02-11 DIAGNOSIS — L309 Dermatitis, unspecified: Secondary | ICD-10-CM | POA: Diagnosis not present

## 2019-02-11 DIAGNOSIS — Z880 Allergy status to penicillin: Secondary | ICD-10-CM | POA: Insufficient documentation

## 2019-02-11 DIAGNOSIS — Z905 Acquired absence of kidney: Secondary | ICD-10-CM | POA: Insufficient documentation

## 2019-02-11 DIAGNOSIS — Z8371 Family history of colonic polyps: Secondary | ICD-10-CM | POA: Insufficient documentation

## 2019-02-11 HISTORY — PX: COLONOSCOPY WITH PROPOFOL: SHX5780

## 2019-02-11 SURGERY — COLONOSCOPY WITH PROPOFOL
Anesthesia: General

## 2019-02-11 MED ORDER — PROPOFOL 500 MG/50ML IV EMUL
INTRAVENOUS | Status: DC | PRN
  Administered 2019-02-11: 200 ug/kg/min via INTRAVENOUS

## 2019-02-11 MED ORDER — PROPOFOL 10 MG/ML IV BOLUS
INTRAVENOUS | Status: DC | PRN
  Administered 2019-02-11 (×2): 100 mg via INTRAVENOUS
  Administered 2019-02-11: 50 mg via INTRAVENOUS

## 2019-02-11 MED ORDER — SODIUM CHLORIDE 0.9 % IV SOLN
INTRAVENOUS | Status: DC
  Administered 2019-02-11: 11:00:00 via INTRAVENOUS

## 2019-02-11 NOTE — Op Note (Signed)
[facility] Patient Name: Brent Myers Procedure Date: 02/11/2019 11:00 AM MRN: [id number] Account #: [id number] Date of Birth: Sep 13, 1970 Admit Type: Outpatient Age: 48 Room: [facility] ENDO ROOM 4 Gender: Male Note Status: Finalized Procedure:             Colonoscopy Indications:           Colon cancer screening in patient at increased risk:                         Family history of 1st-degree relative with colon polyps Providers:             Jonathon Bellows MD, MD Referring MD:          Halina Maidens, MD (Referring MD) Medicines:             Monitored Anesthesia Care Complications:         No immediate complications. Procedure:             Pre-Anesthesia Assessment:                        - Prior to the procedure, a History and Physical was                         performed, and patient medications, allergies and                         sensitivities were reviewed. The patient's tolerance                         of previous anesthesia was reviewed.                        - The risks and benefits of the procedure and the                         sedation options and risks were discussed with the                         patient. All questions were answered and informed                         consent was obtained.                        - ASA Grade Assessment: II - A patient with mild                         systemic disease.                        After obtaining informed consent, the colonoscope was                         passed under direct vision. Throughout the procedure,                         the patient's blood pressure, pulse, and oxygen                         saturations  were monitored continuously. The                         Colonoscope was introduced through the anus and                         advanced to the the cecum, identified by the                         appendiceal orifice. The colonoscopy was performed                         with  ease. The patient tolerated the procedure well.                         The quality of the bowel preparation was excellent. Findings:      The perianal and digital rectal examinations were normal.      Three sessile polyps were found in the sigmoid colon. The polyps were 5       to 7 mm in size. These polyps were removed with a cold snare. Resection       and retrieval were complete.      Two sessile polyps were found in the ascending colon. The polyps were 3       to 4 mm in size. These polyps were removed with a cold biopsy forceps.       Resection and retrieval were complete.      Non-bleeding internal hemorrhoids were found during retroflexion. The       hemorrhoids were medium-sized and Grade I (internal hemorrhoids that do       not prolapse). Impression:            - Three 5 to 7 mm polyps in the sigmoid colon, removed                         with a cold snare. Resected and retrieved.                        - Two 3 to 4 mm polyps in the ascending colon, removed                         with a cold biopsy forceps. Resected and retrieved.                        - Non-bleeding internal hemorrhoids. Recommendation:        - Discharge patient to home (with escort).                        - Resume previous diet.                        - Continue present medications.                        - Await pathology results.                        - Repeat colonoscopy for surveillance based on  pathology results. Procedure Code(s):     --- Professional ---                        [phone], Colonoscopy, flexible; with removal of                         tumor(s), polyp(s), or other lesion(s) by snare                         technique                        45380, 85, Colonoscopy, flexible; with biopsy, single                         or multiple Diagnosis Code(s):     --- Professional ---                        Z83.71, Family history of colonic polyps                         K63.5, Polyp of colon                        K64.0, First degree hemorrhoids CPT copyright 2019 American Medical Association. All rights reserved. The codes documented in this report are preliminary and upon coder review may  be revised to meet current compliance requirements. Jonathon Bellows, MD Jonathon Bellows MD, MD 02/11/2019 11:24:14 AM This report has been signed electronically. Number of Addenda: 0 Note Initiated On: 02/11/2019 11:00 AM Scope Withdrawal Time: 0 hours 14 minutes 48 seconds  Total Procedure Duration: 0 hours 16 minutes 21 seconds  Estimated Blood Loss:  Estimated blood loss: none.      [facility]

## 2019-02-11 NOTE — Transfer of Care (Signed)
Immediate Anesthesia Transfer of Care Note  Patient: Brent Myers  Procedure(s) Performed: COLONOSCOPY WITH PROPOFOL (N/A )  Patient Location: PACU  Anesthesia Type:General  Level of Consciousness: sedated  Airway & Oxygen Therapy: Patient Spontanous Breathing  Post-op Assessment: Report given to RN and Post -op Vital signs reviewed and stable  Post vital signs: Reviewed and stable  Last Vitals:  Vitals Value Taken Time  BP 99/68 02/11/19 1127  Temp 37.2 C 02/11/19 1127  Pulse 96 02/11/19 1127  Resp 18 02/11/19 1127  SpO2 99 % 02/11/19 1127  Vitals shown include unvalidated device data.  Last Pain:  Vitals:   02/11/19 1127  TempSrc:   PainSc: 0-No pain         Complications: No apparent anesthesia complications

## 2019-02-11 NOTE — Anesthesia Postprocedure Evaluation (Signed)
Anesthesia Post Note  Patient: Brent Myers  Procedure(s) Performed: COLONOSCOPY WITH PROPOFOL (N/A )  Patient location during evaluation: Endoscopy Anesthesia Type: General Level of consciousness: awake and alert Pain management: pain level controlled Vital Signs Assessment: post-procedure vital signs reviewed and stable Respiratory status: spontaneous breathing, nonlabored ventilation, respiratory function stable and patient connected to nasal cannula oxygen Cardiovascular status: blood pressure returned to baseline and stable Postop Assessment: no apparent nausea or vomiting Anesthetic complications: no     Last Vitals:  Vitals:   02/11/19 1150 02/11/19 1157  BP: 108/72 117/72  Pulse: 75 68  Resp: (!) 9 18  Temp:    SpO2: 100% 100%    Last Pain:  Vitals:   02/11/19 1157  TempSrc:   PainSc: 0-No pain                 Martha Clan

## 2019-02-11 NOTE — Anesthesia Post-op Follow-up Note (Signed)
Anesthesia QCDR form completed.        

## 2019-02-11 NOTE — H&P (Addendum)
Jonathon Bellows, MD [address], [place], [place], [place], [id number], [place], [place], [place], [address] Phone: [phone]  Fax: [phone]  Primary Care Physician:  Glean Hess, MD   Pre-Procedure History & Physical: HPI:  Brent Myers is a 48 y.o. male is here for an colonoscopy.   Past Medical History:  Diagnosis Date  . Acid reflux   . Eczema     Past Surgical History:  Procedure Laterality Date  . NEPHRECTOMY LIVING DONOR Right 2005    Prior to Admission medications   Medication Sig Start Date End Date Taking? Authorizing Provider  ammonium lactate (AMLACTIN) 12 % cream  07/29/18  Yes [provider]  clobetasol cream (TEMOVATE) 0.05 % APPLY EXTERNALLY TO THE AFFECTED AREA TWICE DAILY 01/23/18   Glean Hess, MD  hydrOXYzine (ATARAX/VISTARIL) 25 MG tablet Take 1 tablet (25 mg total) by mouth every 6 (six) hours as needed for itching. 02/02/18   Melynda Ripple, MD    Allergies as of 01/22/2019 - Review Complete 01/14/2019  Allergen Reaction Noted  . Peanut-containing drug products  02/02/2018  . Penicillins Other (See Comments) 05/01/2015  . Sesame seed (diagnostic) Hives 05/01/2015  . Shellfish allergy  05/01/2015    Family History  Problem Relation Age of Onset  . Renal Disease Mother   . Thyroid disease Father   . Pancreatic cancer Maternal Grandmother     Social History   Socioeconomic History  . Marital status: Married    Spouse name: Not on file  . Number of children: Not on file  . Years of education: Not on file  . Highest education level: Not on file  Occupational History  . Not on file  Social Needs  . Financial resource strain: Not on file  . Food insecurity    Worry: Not on file    Inability: Not on file  . Transportation needs    Medical: Not on file    Non-medical: Not on file  Tobacco Use  . Smoking status: Never Smoker  . Smokeless tobacco: Never Used  Substance and Sexual  Activity  . Alcohol use: Yes    Alcohol/week: 4.0 standard drinks    Types: 4 Standard drinks or equivalent per week    Comment: rarely  . Drug use: No  . Sexual activity: Yes    Birth control/protection: None  Lifestyle  . Physical activity    Days per week: Not on file    Minutes per session: Not on file  . Stress: Not on file  Relationships  . Social Herbalist on phone: Not on file    Gets together: Not on file    Attends religious service: Not on file    Active member of club or organization: Not on file    Attends meetings of clubs or organizations: Not on file    Relationship status: Not on file  . Intimate partner violence    Fear of current or ex partner: Not on file    Emotionally abused: Not on file    Physically abused: Not on file    Forced sexual activity: Not on file  Other Topics Concern  . Not on file  Social History Narrative  . Not on file    Review of Systems: See HPI, otherwise negative ROS  Physical Exam: BP 127/83   Pulse 73   Temp 97.7 F (36.5 C) (Temporal)  Resp 16   Ht 5\' 10"  (1.778 m)   Wt 78.5 kg   SpO2 100%   BMI 24.82 kg/m  General:   Alert,  pleasant and cooperative in NAD Head:  Normocephalic and atraumatic. Neck:  Supple; no masses or thyromegaly. Lungs:  Clear throughout to auscultation, normal respiratory effort.    Heart:  +S1, +S2, Regular rate and rhythm, No edema. Abdomen:  Soft, nontender and nondistended. Normal bowel sounds, without guarding, and without rebound.   Neurologic:  Alert and  oriented x4;  grossly normal neurologically.  Impression/Plan: Brent Myers is here for an colonoscopy to be performed for Screening colonoscopy parent  had colon polyps  Risks, benefits, limitations, and alternatives regarding  colonoscopy have been reviewed with the patient.  Questions have been answered.  All parties agreeable.   Jonathon Bellows, MD  02/11/2019, 10:50 AM

## 2019-02-11 NOTE — Anesthesia Preprocedure Evaluation (Signed)
Anesthesia Evaluation  Patient identified by MRN, date of birth, ID band Patient awake    Reviewed: Allergy & Precautions, H&P , NPO status , Patient's Chart, lab work & pertinent test results, reviewed documented beta blocker date and time   History of Anesthesia Complications Negative for: history of anesthetic complications  Airway Mallampati: I  TM Distance: >3 FB Neck ROM: full    Dental  (+) Dental Advidsory Given, Teeth Intact, Caps   Pulmonary neg pulmonary ROS,    Pulmonary exam normal        Cardiovascular Exercise Tolerance: Good negative cardio ROS Normal cardiovascular exam     Neuro/Psych negative neurological ROS  negative psych ROS   GI/Hepatic Neg liver ROS, GERD  ,  Endo/Other  negative endocrine ROS  Renal/GU negative Renal ROS  negative genitourinary   Musculoskeletal   Abdominal   Peds  Hematology negative hematology ROS (+)   Anesthesia Other Findings Past Medical History: No date: Acid reflux No date: Eczema   Reproductive/Obstetrics negative OB ROS                             Anesthesia Physical Anesthesia Plan  ASA: II  Anesthesia Plan: General   Post-op Pain Management:    Induction: Intravenous  PONV Risk Score and Plan: 2 and Propofol infusion and TIVA  Airway Management Planned: Natural Airway and Nasal Cannula  Additional Equipment:   Intra-op Plan:   Post-operative Plan:   Informed Consent: I have reviewed the patients History and Physical, chart, labs and discussed the procedure including the risks, benefits and alternatives for the proposed anesthesia with the patient or authorized representative who has indicated his/her understanding and acceptance.     Dental Advisory Given  Plan Discussed with: Anesthesiologist, CRNA and Surgeon  Anesthesia Plan Comments:         Anesthesia Quick Evaluation

## 2019-02-12 ENCOUNTER — Encounter: Payer: Self-pay | Admitting: Internal Medicine

## 2019-02-12 ENCOUNTER — Encounter: Payer: Self-pay | Admitting: Gastroenterology

## 2019-02-12 DIAGNOSIS — D126 Benign neoplasm of colon, unspecified: Secondary | ICD-10-CM | POA: Insufficient documentation

## 2019-02-12 LAB — SURGICAL PATHOLOGY

## 2019-02-14 ENCOUNTER — Encounter: Payer: Self-pay | Admitting: Gastroenterology

## 2019-04-13 ENCOUNTER — Ambulatory Visit: Payer: Commercial Managed Care - PPO | Admitting: Podiatry

## 2019-04-13 ENCOUNTER — Encounter: Payer: Self-pay | Admitting: Podiatry

## 2019-04-13 ENCOUNTER — Other Ambulatory Visit: Payer: Self-pay

## 2019-04-13 DIAGNOSIS — B351 Tinea unguium: Secondary | ICD-10-CM

## 2019-04-13 MED ORDER — TERBINAFINE HCL 250 MG PO TABS: 250.0000 mg | ORAL_TABLET | Freq: Every day | ORAL | 2 refills | Status: DC

## 2019-04-15 NOTE — Progress Notes (Signed)
   Subjective: 49 y.o. male presenting today as a new patient with a chief complaint of possible nail fungus of toes 1-5 bilaterally that have been present for the past year. He reports associated thickening and discoloration of the nails. He reports some associated intermittent tenderness of the nails. He states some nails have fallen off. He has used antifungal nail lacquer and tea tree oil for treatment with no significant relief. Patient is here for further evaluation and treatment.   Past Medical History:  Diagnosis Date  . Acid reflux   . Eczema     Objective: Physical Exam General: The patient is alert and oriented x3 in no acute distress.  Dermatology: Hyperkeratotic, discolored, thickened, onychodystrophy noted to nails 1-5 bialterally. Skin is warm, dry and supple bilateral lower extremities. Negative for open lesions or macerations.  Vascular: Palpable pedal pulses bilaterally. No edema or erythema noted. Capillary refill within normal limits.  Neurological: Epicritic and protective threshold grossly intact bilaterally.   Musculoskeletal Exam: Range of motion within normal limits to all pedal and ankle joints bilateral. Muscle strength 5/5 in all groups bilateral.   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 #2 Prescription for Lamisil 250 mg #90 provided to patient.  #3 Return to clinic as needed.    Edrick Kins, DPM [facility]  Dr. Edrick Kins, [address] [place]                                        [place], [place] [address]                Office [phone]  Fax [phone]

## 2019-06-08 ENCOUNTER — Other Ambulatory Visit: Payer: Self-pay

## 2019-06-08 ENCOUNTER — Ambulatory Visit: Payer: Commercial Managed Care - PPO | Admitting: Podiatry

## 2019-06-08 DIAGNOSIS — B351 Tinea unguium: Secondary | ICD-10-CM | POA: Diagnosis not present

## 2019-06-11 NOTE — Progress Notes (Signed)
   Subjective: 49 y.o. male presenting today for follow up evaluation of fungal nails 1-5 bilaterally. He states the nails look improved and he has been taking the Lamisil as directed. He reports needing a refill on the Lamisil. There are no worsening factors noted. Patient is here for further evaluation and treatment.   Past Medical History:  Diagnosis Date  . Acid reflux   . Eczema     Objective: Physical Exam General: The patient is alert and oriented x3 in no acute distress.  Dermatology: Hyperkeratotic, discolored, thickened, onychodystrophy noted to nails 1-5 bialterally. Skin is warm, dry and supple bilateral lower extremities. Negative for open lesions or macerations.  Vascular: Palpable pedal pulses bilaterally. No edema or erythema noted. Capillary refill within normal limits.  Neurological: Epicritic and protective threshold grossly intact bilaterally.   Musculoskeletal Exam: Range of motion within normal limits to all pedal and ankle joints bilateral. Muscle strength 5/5 in all groups bilateral.   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 #2 Mechanical debridement of nails 1-5 bilaterally performed using a nail nipper. Filed with dremel without incident.  #3 Continue taking oral Lamisil 250 mg.  #4 Return to clinic as needed.     Edrick Kins, DPM [facility]  Dr. Edrick Kins, [place]                                        [place], [place] [address]                Office [phone]  Fax [phone]

## 2019-07-12 ENCOUNTER — Other Ambulatory Visit: Payer: Self-pay | Admitting: Podiatry

## 2019-08-06 ENCOUNTER — Other Ambulatory Visit: Payer: Self-pay

## 2019-08-06 ENCOUNTER — Ambulatory Visit: Payer: Commercial Managed Care - PPO | Admitting: Podiatry

## 2019-08-06 DIAGNOSIS — Z79899 Other long term (current) drug therapy: Secondary | ICD-10-CM

## 2019-08-06 DIAGNOSIS — B351 Tinea unguium: Secondary | ICD-10-CM | POA: Diagnosis not present

## 2019-08-06 MED ORDER — TERBINAFINE HCL 250 MG PO TABS: ORAL_TABLET | ORAL | 0 refills | Status: AC

## 2019-08-07 LAB — HEPATIC FUNCTION PANEL
ALT: 20 IU/L (ref 0–44)
AST: 21 IU/L (ref 0–40)
Albumin: 4.4 g/dL (ref 4.0–5.0)
Alkaline Phosphatase: 90 IU/L (ref 39–117)
Bilirubin Total: 0.7 mg/dL (ref 0.0–1.2)
Bilirubin, Direct: 0.2 mg/dL (ref 0.00–0.40)
Total Protein: 6.5 g/dL (ref 6.0–8.5)

## 2019-08-08 NOTE — Progress Notes (Signed)
   Subjective: 49 y.o. male presenting today for follow up evaluation of fungal nails 1-5 bilaterally. He reports some improvement of the nails after completing a course of Lamisil. He would like to try another round of Lamisil. There are no worsening factors noted. Patient is here for further evaluation and treatment.   Past Medical History:  Diagnosis Date  . Acid reflux   . Eczema     Objective: Physical Exam General: The patient is alert and oriented x3 in no acute distress.  Dermatology: Hyperkeratotic, discolored, thickened, onychodystrophy noted to nails 1-5 bialterally. Skin is warm, dry and supple bilateral lower extremities. Negative for open lesions or macerations.  Vascular: Palpable pedal pulses bilaterally. No edema or erythema noted. Capillary refill within normal limits.  Neurological: Epicritic and protective threshold grossly intact bilaterally.   Musculoskeletal Exam: Range of motion within normal limits to all pedal and ankle joints bilateral. Muscle strength 5/5 in all groups bilateral.   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 #2 Liver function test ordered.  #3 Prescription for Lamisil 250 mg #90 provided to patient.  #4 Return to clinic as needed.      Edrick Kins, DPM [facility]  Dr. Edrick Kins, [place]                                        [place], [place] [address]                Office [phone]  Fax [phone]

## 2020-01-25 ENCOUNTER — Encounter: Payer: 59 | Admitting: Internal Medicine
